# Patient Record
Sex: Female | Born: 1980 | Hispanic: Yes | Smoking: Former smoker
Health system: Southern US, Community
[De-identification: ages and names within clinical notes are randomized; demographics above are authoritative.]

## PROBLEM LIST (undated history)

## (undated) DIAGNOSIS — R87629 Unspecified abnormal cytological findings in specimens from vagina: Secondary | ICD-10-CM

## (undated) DIAGNOSIS — G43909 Migraine, unspecified, not intractable, without status migrainosus: Secondary | ICD-10-CM

## (undated) DIAGNOSIS — N200 Calculus of kidney: Secondary | ICD-10-CM

## (undated) HISTORY — DX: Unspecified abnormal cytological findings in specimens from vagina: R87.629

## (undated) HISTORY — PX: COLPOSCOPY: SHX161

## (undated) HISTORY — DX: Calculus of kidney: N20.0

## (undated) HISTORY — DX: Migraine, unspecified, not intractable, without status migrainosus: G43.909

---

## 2018-06-20 ENCOUNTER — Encounter: Payer: Self-pay | Admitting: Sports Medicine

## 2018-06-20 ENCOUNTER — Ambulatory Visit (INDEPENDENT_AMBULATORY_CARE_PROVIDER_SITE_OTHER): Payer: BLUE CROSS/BLUE SHIELD | Admitting: Sports Medicine

## 2018-06-20 ENCOUNTER — Ambulatory Visit (INDEPENDENT_AMBULATORY_CARE_PROVIDER_SITE_OTHER): Payer: BLUE CROSS/BLUE SHIELD

## 2018-06-20 DIAGNOSIS — M25551 Pain in right hip: Secondary | ICD-10-CM | POA: Diagnosis not present

## 2018-06-20 DIAGNOSIS — G8929 Other chronic pain: Secondary | ICD-10-CM | POA: Insufficient documentation

## 2018-06-20 DIAGNOSIS — M5412 Radiculopathy, cervical region: Secondary | ICD-10-CM

## 2018-06-20 DIAGNOSIS — R519 Headache, unspecified: Secondary | ICD-10-CM | POA: Insufficient documentation

## 2018-06-20 DIAGNOSIS — M4722 Other spondylosis with radiculopathy, cervical region: Secondary | ICD-10-CM | POA: Diagnosis not present

## 2018-06-20 DIAGNOSIS — R51 Headache: Secondary | ICD-10-CM

## 2018-06-20 MED ORDER — PREDNISONE 50 MG PO TABS: ORAL_TABLET | ORAL | 0 refills | Status: DC

## 2018-06-20 MED ORDER — GABAPENTIN 300 MG PO CAPS: ORAL_CAPSULE | ORAL | 3 refills | Status: DC

## 2018-06-20 NOTE — Progress Notes (Signed)
Subjective:    CC: Back pain, hip pain, headaches, neck pain  HPI:  This is a pleasant 37 year old female, she endorses a several month history of mild headaches, that usually occur several hours after eating, she generally feels hungry, developed a headache, and these resolved with eating.  Hip pain: Right sided, localized in the groin, worse with prolonged walking.  No mechanical symptoms, no trauma, no back pain, nothing radiating down past the thigh.  Neck pain: Right-sided, radiation with numbness and tingling to the first, second, third fingers, no progressive weakness, no trauma, no constitutional symptoms.  I reviewed the past medical history, family history, social history, surgical history, and allergies today and no changes were needed.  Please see the problem list section below in epic for further details.  Past Medical History: Past Medical History:  Diagnosis Date  . Kidney stones   . Migraine    Past Surgical History: History reviewed. No pertinent surgical history. Social History: Social History   Socioeconomic History  . Marital status: Married    Spouse name: Not on file  . Number of children: Not on file  . Years of education: Not on file  . Highest education level: Not on file  Occupational History  . Not on file  Social Needs  . Financial resource strain: Not on file  . Food insecurity:    Worry: Not on file    Inability: Not on file  . Transportation needs:    Medical: Not on file    Non-medical: Not on file  Tobacco Use  . Smoking status: Former Games developermoker  . Smokeless tobacco: Never Used  Substance and Sexual Activity  . Alcohol use: Not Currently  . Drug use: Never  . Sexual activity: Yes  Lifestyle  . Physical activity:    Days per week: Not on file    Minutes per session: Not on file  . Stress: Not on file  Relationships  . Social connections:    Talks on phone: Not on file    Gets together: Not on file    Attends religious service: Not  on file    Active member of club or organization: Not on file    Attends meetings of clubs or organizations: Not on file    Relationship status: Not on file  Other Topics Concern  . Not on file  Social History Narrative  . Not on file   Family History: Family History  Problem Relation Age of Onset  . Cancer Maternal Aunt   . Cancer Paternal Aunt   . Cancer Maternal Grandmother   . Cancer Paternal Grandmother    Allergies: No Known Allergies Medications: See med rec.  Review of Systems: No headache, visual changes, nausea, vomiting, diarrhea, constipation, dizziness, abdominal pain, skin rash, fevers, chills, night sweats, swollen lymph nodes, weight loss, chest pain, body aches, joint swelling, muscle aches, shortness of breath, mood changes, visual or auditory hallucinations.  Objective:    General: Well Developed, well nourished, and in no acute distress.  Neuro: Alert and oriented x3, extra-ocular muscles intact, sensation grossly intact.  HEENT: Normocephalic, atraumatic, pupils equal round reactive to light, neck supple, no masses, no lymphadenopathy, thyroid nonpalpable.  Skin: Warm and dry, no rashes noted.  Cardiac: Regular rate and rhythm, no murmurs rubs or gallops.  Respiratory: Clear to auscultation bilaterally. Not using accessory muscles, speaking in full sentences.  Abdominal: Soft, nontender, nondistended, positive bowel sounds, no masses, no organomegaly.  Right Hip: ROM IR: 60 Deg, ER:  60 Deg, Flexion: 120 Deg, Extension: 100 Deg, Abduction: 45 Deg, Adduction: 45 Deg Strength IR: 5/5, ER: 5/5, Flexion: 5/5, Extension: 5/5, Abduction: 5/5, Adduction: 5/5 Pelvic alignment unremarkable to inspection and palpation. Standing hip rotation and gait without trendelenburg / unsteadiness. Greater trochanter without tenderness to palpation. No tenderness over piriformis. No SI joint tenderness and normal minimal SI movement. Negative FADIR test Neck: Negative  spurling's Full neck range of motion Grip strength and sensation normal in bilateral hands Strength good C4 to T1 distribution No sensory change to C4 to T1 Reflexes normal  Impression and Recommendations:    The patient was counselled, risk factors were discussed, anticipatory guidance given.  Frequent headaches Typical occur during long periods of not eating. She will try multiple small meals through the day, if persistence we will evaluate further for migraines.  Right cervical radiculopathy Prednisone, gabapentin. X-rays, rehab exercises given, return to see me in a month, MR for interventional planning if no better.  Right hip pain Likely mild OA. Prednisone will help, getting some x-rays. __________________________________________ Ihor Austin. Benjamin Stain, M.D., ABFM., CAQSM. Primary Care and Sports Medicine [facility] [facility]  Adjunct Professor of Family Medicine  [employer]

## 2018-06-20 NOTE — Assessment & Plan Note (Signed)
Typical occur during long periods of not eating. She will try multiple small meals through the day, if persistence we will evaluate further for migraines.

## 2018-06-20 NOTE — Assessment & Plan Note (Signed)
Prednisone, gabapentin. X-rays, rehab exercises given, return to see me in a month, MR for interventional planning if no better.

## 2018-06-20 NOTE — Assessment & Plan Note (Signed)
Likely mild OA. Prednisone will help, getting some x-rays.

## 2018-06-25 ENCOUNTER — Ambulatory Visit (INDEPENDENT_AMBULATORY_CARE_PROVIDER_SITE_OTHER): Payer: BLUE CROSS/BLUE SHIELD | Admitting: Obstetrics & Gynecology

## 2018-06-25 ENCOUNTER — Encounter: Payer: Self-pay | Admitting: Obstetrics & Gynecology

## 2018-06-25 VITALS — BP 115/76 | HR 74 | Ht 61.0 in | Wt 152.0 lb

## 2018-06-25 DIAGNOSIS — Z13220 Encounter for screening for lipoid disorders: Secondary | ICD-10-CM | POA: Diagnosis not present

## 2018-06-25 DIAGNOSIS — Z808 Family history of malignant neoplasm of other organs or systems: Secondary | ICD-10-CM | POA: Diagnosis not present

## 2018-06-25 DIAGNOSIS — Z8041 Family history of malignant neoplasm of ovary: Secondary | ICD-10-CM | POA: Diagnosis not present

## 2018-06-25 DIAGNOSIS — Z3169 Encounter for other general counseling and advice on procreation: Secondary | ICD-10-CM

## 2018-06-25 DIAGNOSIS — E559 Vitamin D deficiency, unspecified: Secondary | ICD-10-CM

## 2018-06-25 DIAGNOSIS — B373 Candidiasis of vulva and vagina: Secondary | ICD-10-CM | POA: Diagnosis not present

## 2018-06-25 DIAGNOSIS — N898 Other specified noninflammatory disorders of vagina: Secondary | ICD-10-CM

## 2018-06-25 DIAGNOSIS — Z1151 Encounter for screening for human papillomavirus (HPV): Secondary | ICD-10-CM | POA: Diagnosis not present

## 2018-06-25 DIAGNOSIS — Z131 Encounter for screening for diabetes mellitus: Secondary | ICD-10-CM

## 2018-06-25 DIAGNOSIS — Z113 Encounter for screening for infections with a predominantly sexual mode of transmission: Secondary | ICD-10-CM

## 2018-06-25 DIAGNOSIS — Z124 Encounter for screening for malignant neoplasm of cervix: Secondary | ICD-10-CM | POA: Diagnosis not present

## 2018-06-25 DIAGNOSIS — Z01419 Encounter for gynecological examination (general) (routine) without abnormal findings: Secondary | ICD-10-CM

## 2018-06-25 MED ORDER — PRENATAL VITAMINS 0.8 MG PO TABS: 1.0000 | ORAL_TABLET | Freq: Every day | ORAL | 12 refills | Status: DC

## 2018-06-25 MED ORDER — VALACYCLOVIR HCL 500 MG PO TABS: 500.0000 mg | ORAL_TABLET | Freq: Every day | ORAL | 12 refills | Status: DC

## 2018-06-25 NOTE — Progress Notes (Signed)
Last pap- June 2017- negative

## 2018-06-25 NOTE — Progress Notes (Signed)
Subjective:     Jennifer Jacobson is a 37 y.o. female here for a routine exam.  Current complaints: Has HSV outbreaks and uses acyclovir topical.  Husband does not know she has HSV.  She has never been on oral preventative.  Wants full STD testing and preconceptual genetic tests.  Maternal anut died of ovarian cancer and MGM died of metastatic cancer with unknown primary.    Gynecologic History Patient's last menstrual period was 06/04/2018. Contraception: none Last Pap: 2017. Results were: normal (per pt in [place]) Last mammogram: n/a.   Obstetric History OB History  Gravida Para Term Preterm AB Living  2 1 1   1 1   SAB TAB Ectopic Multiple Live Births  1            # Outcome Date GA Lbr Len/2nd Weight Sex Delivery Anes PTL Lv  2 SAB           1 Term              The following portions of the patient's history were reviewed and updated as appropriate: allergies, current medications, past family history, past medical history, past social history, past surgical history and problem list.  Review of Systems Pertinent items noted in HPI and remainder of comprehensive ROS otherwise negative.    Objective:      Vitals:   06/25/18 0850  BP: 115/76  Pulse: 74  Weight: 152 lb (68.9 kg)  Height: 5\' 1"  (1.549 m)   Vitals:  WNL General appearance: alert, cooperative and no distress  HEENT: Normocephalic, without obvious abnormality, atraumatic Eyes: negative Throat: lips, mucosa, and tongue normal; teeth and gums normal  Respiratory: Clear to auscultation bilaterally  CV: Regular rate and rhythm  Breasts:  Normal appearance, no masses or tenderness, no nipple retraction or dimpling  GI: Soft, non-tender; bowel sounds normal; no masses,  no organomegaly  GU: External Genitalia:  Tanner V, no lesion Urethra:  No prolapse   Vagina: Pink, normal rugae, no blood or discharge  Cervix: No CMT, no lesion  Uterus:  Normal size and contour, non tender  Adnexa: Normal, no masses,  non tender  Musculoskeletal: No edema, redness or tenderness in the calves or thighs  Skin: No lesions or rash  Lymphatic: Axillary adenopathy: none     Psychiatric: Normal mood and behavior        Assessment:    Healthy female exam.    Plan:   1.  Pap smear with co-testing 2. Preconceptual genetic testing 3.  PNV (wants to conceive) 4.  Full STD panel 5.  Daily Valtrex to prevent HSV transmission 6.  My Risk testing for Mat Aunt with Ovarian Cancer (mother in [place] and can't be tested)

## 2018-06-26 LAB — CYTOLOGY - PAP
Bacterial vaginitis: NEGATIVE
Candida vaginitis: POSITIVE — AB
Chlamydia: NEGATIVE
Diagnosis: NEGATIVE
HPV (WINDOPATH): DETECTED — AB
NEISSERIA GONORRHEA: NEGATIVE
TRICH (WINDOWPATH): NEGATIVE

## 2018-06-27 ENCOUNTER — Encounter: Payer: Self-pay | Admitting: Obstetrics & Gynecology

## 2018-06-27 DIAGNOSIS — B977 Papillomavirus as the cause of diseases classified elsewhere: Secondary | ICD-10-CM | POA: Insufficient documentation

## 2018-06-27 LAB — HEMOGLOBINOPATHY EVALUATION
HCT: 37 % (ref 35.0–45.0)
Hemoglobin A2 - HGBRFX: 2.4 % (ref 1.8–3.5)
Hemoglobin: 12.5 g/dL (ref 11.7–15.5)
Hgb A: 96.6 % (ref >96.0)
MCH: 30.7 pg (ref 27.0–33.0)
MCV: 90.9 fL (ref 80.0–100.0)
RBC: 4.07 10*6/uL (ref 3.80–5.10)
RDW: 12.4 % (ref 11.0–15.0)

## 2018-06-28 DIAGNOSIS — B379 Candidiasis, unspecified: Secondary | ICD-10-CM

## 2018-06-28 MED ORDER — FLUCONAZOLE 150 MG PO TABS: 150.0000 mg | ORAL_TABLET | Freq: Once | ORAL | 0 refills | Status: AC

## 2018-06-28 NOTE — Telephone Encounter (Signed)
Sent pt a MyChart message letting her know of pap smear results and to repeat pap in a year. Also let her know of yeast infection and that Diflucan has been sent to her pharmacy.

## 2018-07-04 ENCOUNTER — Encounter: Payer: Self-pay | Admitting: Sports Medicine

## 2018-07-05 LAB — CBC
HCT: 37.5 % (ref 35.0–45.0)
Hemoglobin: 12.5 g/dL (ref 11.7–15.5)
MCH: 30.5 pg (ref 27.0–33.0)
MCHC: 33.3 g/dL (ref 32.0–36.0)
MCV: 91.5 fL (ref 80.0–100.0)
MPV: 11.1 fL (ref 7.5–12.5)
Platelets: 248 10*3/uL (ref 140–400)
RBC: 4.1 10*6/uL (ref 3.80–5.10)
RDW: 12.2 % (ref 11.0–15.0)
WBC: 8 10*3/uL (ref 3.8–10.8)

## 2018-07-05 LAB — LIPID PANEL
CHOLESTEROL: 199 mg/dL (ref <200)
HDL: 42 mg/dL — AB (ref >50)
LDL Cholesterol (Calc): 136 mg/dL (calc) — ABNORMAL HIGH
Non-HDL Cholesterol (Calc): 157 mg/dL (calc) — ABNORMAL HIGH (ref <130)
Total CHOL/HDL Ratio: 4.7 (calc) (ref <5.0)
Triglycerides: 104 mg/dL (ref <150)

## 2018-07-05 LAB — CYSTIC FIBROSIS DIAGNOSTIC STUDY

## 2018-07-05 LAB — SPINAL MUSCULAR ATROPHY CARRIER
SMN1: 2
SMN2: >=3

## 2018-07-05 LAB — COMPREHENSIVE METABOLIC PANEL
AG Ratio: 1.5 (calc) (ref 1.0–2.5)
ALBUMIN MSPROF: 4 g/dL (ref 3.6–5.1)
ALT: 12 U/L (ref 6–29)
AST: 15 U/L (ref 10–30)
Alkaline phosphatase (APISO): 38 U/L (ref 33–115)
BUN: 15 mg/dL (ref 7–25)
CO2: 28 mmol/L (ref 20–32)
Calcium: 9.4 mg/dL (ref 8.6–10.2)
Chloride: 107 mmol/L (ref 98–110)
Creat: 0.82 mg/dL (ref 0.50–1.10)
Globulin: 2.7 g/dL (calc) (ref 1.9–3.7)
Glucose, Bld: 93 mg/dL (ref 65–99)
Potassium: 4.3 mmol/L (ref 3.5–5.3)
Sodium: 142 mmol/L (ref 135–146)
Total Bilirubin: 0.4 mg/dL (ref 0.2–1.2)
Total Protein: 6.7 g/dL (ref 6.1–8.1)

## 2018-07-05 LAB — VITAMIN D 25 HYDROXY (VIT D DEFICIENCY, FRACTURES): Vit D, 25-Hydroxy: 26 ng/mL — ABNORMAL LOW (ref 30–100)

## 2018-07-05 LAB — HIV ANTIBODY (ROUTINE TESTING W REFLEX): HIV 1&2 Ab, 4th Generation: NONREACTIVE

## 2018-07-05 LAB — HEMOGLOBIN A1C
HEMOGLOBIN A1C: 5.2 %{Hb} (ref <5.7)
Mean Plasma Glucose: 103 (calc)
eAG (mmol/L): 5.7 (calc)

## 2018-07-05 LAB — RPR: RPR Ser Ql: NONREACTIVE

## 2018-07-05 LAB — HEPATITIS C ANTIBODY
Hepatitis C Ab: NONREACTIVE
SIGNAL TO CUT-OFF: 0.01 (ref <1.00)

## 2018-07-05 LAB — HEPATITIS B SURFACE ANTIGEN: HEP B S AG: NONREACTIVE

## 2018-07-06 ENCOUNTER — Encounter: Payer: Self-pay | Admitting: *Deleted

## 2018-07-06 NOTE — Progress Notes (Signed)
Copy of My Risk mailed to pt's home address and routed to Dr Penne LashLeggett.

## 2018-07-19 ENCOUNTER — Encounter: Payer: Self-pay | Admitting: Sports Medicine

## 2018-07-19 ENCOUNTER — Ambulatory Visit (INDEPENDENT_AMBULATORY_CARE_PROVIDER_SITE_OTHER): Payer: BLUE CROSS/BLUE SHIELD | Admitting: Sports Medicine

## 2018-07-19 DIAGNOSIS — M5412 Radiculopathy, cervical region: Secondary | ICD-10-CM

## 2018-07-19 DIAGNOSIS — R51 Headache: Secondary | ICD-10-CM | POA: Diagnosis not present

## 2018-07-19 DIAGNOSIS — M25551 Pain in right hip: Secondary | ICD-10-CM

## 2018-07-19 DIAGNOSIS — E663 Overweight: Secondary | ICD-10-CM

## 2018-07-19 DIAGNOSIS — E669 Obesity, unspecified: Secondary | ICD-10-CM | POA: Insufficient documentation

## 2018-07-19 DIAGNOSIS — R519 Headache, unspecified: Secondary | ICD-10-CM

## 2018-07-19 NOTE — Assessment & Plan Note (Signed)
Have resolved with eating multiple small meals through the day. No further evaluation for migraines needed.

## 2018-07-19 NOTE — Assessment & Plan Note (Signed)
Resolved with prednisone, gabapentin, rehab exercises. 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

## 2018-07-19 NOTE — Progress Notes (Signed)
Subjective:    CC: Follow-up  HPI: Headaches: Resolved.  Cervical DDD: Pain resolved with gabapentin, prednisone, exercises.  Hip pain: Resolved.  Overweight: Would like referral to nutrition.  I reviewed the past medical history, family history, social history, surgical history, and allergies today and no changes were needed.  Please see the problem list section below in epic for further details.  Past Medical History: Past Medical History:  Diagnosis Date  . Kidney stones   . Migraine   . Vaginal Pap smear, abnormal    Past Surgical History: Past Surgical History:  Procedure Laterality Date  . CESAREAN SECTION    . COLPOSCOPY     Social History: Social History   Socioeconomic History  . Marital status: Married    Spouse name: Not on file  . Number of children: Not on file  . Years of education: Not on file  . Highest education level: Not on file  Occupational History  . Not on file  Social Needs  . Financial resource strain: Not on file  . Food insecurity:    Worry: Not on file    Inability: Not on file  . Transportation needs:    Medical: Not on file    Non-medical: Not on file  Tobacco Use  . Smoking status: Former Smoker    Types: E-cigarettes  . Smokeless tobacco: Never Used  Substance and Sexual Activity  . Alcohol use: Not Currently  . Drug use: Never  . Sexual activity: Yes    Birth control/protection: None  Lifestyle  . Physical activity:    Days per week: Not on file    Minutes per session: Not on file  . Stress: Not on file  Relationships  . Social connections:    Talks on phone: Not on file    Gets together: Not on file    Attends religious service: Not on file    Active member of club or organization: Not on file    Attends meetings of clubs or organizations: Not on file    Relationship status: Not on file  Other Topics Concern  . Not on file  Social History Narrative  . Not on file   Family History: Family History  Problem  Relation Age of Onset  . Ovarian cancer Maternal Aunt 45  . Cancer Maternal Grandmother 33       unkown primary, could be breast  . Hypertension Father   . Heart attack Paternal Grandfather    Allergies: No Known Allergies Medications: See med rec.  Review of Systems: No fevers, chills, night sweats, weight loss, chest pain, or shortness of breath.   Objective:    General: Well Developed, well nourished, and in no acute distress.  Neuro: Alert and oriented x3, extra-ocular muscles intact, sensation grossly intact.  HEENT: Normocephalic, atraumatic, pupils equal round reactive to light, neck supple, no masses, no lymphadenopathy, thyroid nonpalpable.  Skin: Warm and dry, no rashes. Cardiac: Regular rate and rhythm, no murmurs rubs or gallops, no lower extremity edema.  Respiratory: Clear to auscultation bilaterally. Not using accessory muscles, speaking in full sentences.  Impression and Recommendations:    Overweight Referral to [facility] in [place] [employer]. Exercise prescription given.  Frequent headaches Have resolved with eating multiple small meals through the day. No further evaluation for migraines needed.  Right cervical radiculopathy Resolved with prednisone, gabapentin, rehab exercises. 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  Right hip pain Pain was referrable to the hip at the last visit, have resolved now.  Return as needed for this. ___________________________________________ Ihor Austin. Benjamin Stain, M.D., ABFM., CAQSM. Primary Care and Sports Medicine [place] [facility]  Adjunct Professor of Family Medicine  [employer]

## 2018-07-19 NOTE — Assessment & Plan Note (Signed)
Pain was referrable to the hip at the last visit, have resolved now. Return as needed for this.

## 2018-07-19 NOTE — Assessment & Plan Note (Addendum)
Referral to [facility] in [place]. Exercise prescription given.

## 2018-09-07 ENCOUNTER — Encounter: Payer: Self-pay | Admitting: Sports Medicine

## 2018-10-17 ENCOUNTER — Other Ambulatory Visit: Payer: Self-pay | Admitting: *Deleted

## 2018-10-17 DIAGNOSIS — M5412 Radiculopathy, cervical region: Secondary | ICD-10-CM

## 2018-10-17 MED ORDER — GABAPENTIN 300 MG PO CAPS: ORAL_CAPSULE | ORAL | 3 refills | Status: DC

## 2018-11-10 ENCOUNTER — Encounter: Payer: Self-pay | Admitting: Sports Medicine

## 2018-11-13 ENCOUNTER — Encounter: Payer: Self-pay | Admitting: Sports Medicine

## 2018-11-13 NOTE — Telephone Encounter (Signed)
I attempted to contact Tiffany, and left her a voicemail to see what additional information is required.

## 2018-11-15 ENCOUNTER — Encounter (INDEPENDENT_AMBULATORY_CARE_PROVIDER_SITE_OTHER): Payer: BLUE CROSS/BLUE SHIELD | Admitting: Sports Medicine

## 2018-11-15 ENCOUNTER — Other Ambulatory Visit: Payer: Self-pay | Admitting: Obstetrics & Gynecology

## 2018-11-15 ENCOUNTER — Encounter: Payer: Self-pay | Admitting: Sports Medicine

## 2018-11-15 DIAGNOSIS — Z630 Problems in relationship with spouse or partner: Secondary | ICD-10-CM

## 2018-11-15 MED ORDER — ACYCLOVIR 5 % EX OINT: TOPICAL_OINTMENT | CUTANEOUS | 1 refills | Status: DC

## 2018-11-15 MED ORDER — PRENATAL VITAMINS 28-0.8 MG PO TABS: 1.0000 | ORAL_TABLET | Freq: Every day | ORAL | 12 refills | Status: DC

## 2018-11-15 NOTE — Progress Notes (Signed)
Prescription for prenatal vitamins and acyclovir ointment given.

## 2018-11-16 DIAGNOSIS — Z630 Problems in relationship with spouse or partner: Secondary | ICD-10-CM | POA: Insufficient documentation

## 2018-11-16 NOTE — Assessment & Plan Note (Signed)
Husband drinks heavily, I would like her to have some behavioral therapy.  I spent 5 total minutes of online digital evaluation and management services.

## 2018-11-30 ENCOUNTER — Ambulatory Visit (INDEPENDENT_AMBULATORY_CARE_PROVIDER_SITE_OTHER): Payer: BLUE CROSS/BLUE SHIELD | Admitting: Professional

## 2018-11-30 DIAGNOSIS — F4323 Adjustment disorder with mixed anxiety and depressed mood: Secondary | ICD-10-CM

## 2018-12-06 ENCOUNTER — Ambulatory Visit (INDEPENDENT_AMBULATORY_CARE_PROVIDER_SITE_OTHER): Payer: BLUE CROSS/BLUE SHIELD | Admitting: Professional

## 2018-12-06 DIAGNOSIS — F4323 Adjustment disorder with mixed anxiety and depressed mood: Secondary | ICD-10-CM | POA: Diagnosis not present

## 2018-12-14 ENCOUNTER — Ambulatory Visit (INDEPENDENT_AMBULATORY_CARE_PROVIDER_SITE_OTHER): Payer: BLUE CROSS/BLUE SHIELD | Admitting: Professional

## 2018-12-14 DIAGNOSIS — F4323 Adjustment disorder with mixed anxiety and depressed mood: Secondary | ICD-10-CM

## 2018-12-26 ENCOUNTER — Ambulatory Visit (INDEPENDENT_AMBULATORY_CARE_PROVIDER_SITE_OTHER): Payer: BC Managed Care – PPO | Admitting: Professional

## 2018-12-26 DIAGNOSIS — F4323 Adjustment disorder with mixed anxiety and depressed mood: Secondary | ICD-10-CM | POA: Diagnosis not present

## 2019-01-03 ENCOUNTER — Other Ambulatory Visit: Payer: Self-pay

## 2019-01-03 ENCOUNTER — Ambulatory Visit (INDEPENDENT_AMBULATORY_CARE_PROVIDER_SITE_OTHER): Payer: BC Managed Care – PPO | Admitting: Sports Medicine

## 2019-01-03 ENCOUNTER — Encounter: Payer: Self-pay | Admitting: Sports Medicine

## 2019-01-03 ENCOUNTER — Ambulatory Visit (INDEPENDENT_AMBULATORY_CARE_PROVIDER_SITE_OTHER): Payer: BC Managed Care – PPO

## 2019-01-03 VITALS — BP 123/74 | HR 73 | Temp 98.2°F | Ht 61.0 in | Wt 147.0 lb

## 2019-01-03 DIAGNOSIS — J358 Other chronic diseases of tonsils and adenoids: Secondary | ICD-10-CM

## 2019-01-03 DIAGNOSIS — R42 Dizziness and giddiness: Secondary | ICD-10-CM | POA: Diagnosis not present

## 2019-01-03 DIAGNOSIS — H8110 Benign paroxysmal vertigo, unspecified ear: Secondary | ICD-10-CM | POA: Insufficient documentation

## 2019-01-03 DIAGNOSIS — R110 Nausea: Secondary | ICD-10-CM

## 2019-01-03 DIAGNOSIS — M545 Low back pain, unspecified: Secondary | ICD-10-CM | POA: Insufficient documentation

## 2019-01-03 DIAGNOSIS — M533 Sacrococcygeal disorders, not elsewhere classified: Secondary | ICD-10-CM | POA: Diagnosis not present

## 2019-01-03 DIAGNOSIS — H6123 Impacted cerumen, bilateral: Secondary | ICD-10-CM

## 2019-01-03 DIAGNOSIS — H8112 Benign paroxysmal vertigo, left ear: Secondary | ICD-10-CM | POA: Diagnosis not present

## 2019-01-03 LAB — POCT URINALYSIS DIPSTICK
Bilirubin, UA: NEGATIVE
Glucose, UA: NEGATIVE
Ketones, UA: NEGATIVE
Leukocytes, UA: NEGATIVE
Nitrite, UA: NEGATIVE
Protein, UA: NEGATIVE
Spec Grav, UA: 1.015 (ref 1.010–1.025)
Urobilinogen, UA: 0.2 E.U./dL
pH, UA: 5.5 (ref 5.0–8.0)

## 2019-01-03 LAB — POCT URINE PREGNANCY: Preg Test, Ur: NEGATIVE

## 2019-01-03 MED ORDER — PREDNISONE 50 MG PO TABS: ORAL_TABLET | ORAL | 0 refills | Status: DC

## 2019-01-03 NOTE — Assessment & Plan Note (Signed)
Recurrent tonsillitis with tonsilloliths. I would like her to discuss this with ENT.

## 2019-01-03 NOTE — Assessment & Plan Note (Signed)
Left-sided, near the sacroiliac joint. Adding 5 days of prednisone, SI joint rehab exercises, lumbar spine and SI joint x-rays. Return to see me in a month for this.

## 2019-01-03 NOTE — Assessment & Plan Note (Signed)
There is bilateral cerumen impaction, we will irrigate this. The prednisone will likely help as well. She can do the Epley maneuver at home.

## 2019-01-03 NOTE — Progress Notes (Signed)
Subjective:    CC: Multiple issues  HPI: Dizziness: History of vertigo in the past, lately has had some mild vertiginous, spinning symptoms, no tinnitus, no ear pressure.  No other focal neurologic symptoms.  Pharyngitis: Gets frequent episodes of pharyngitis with tonsillitis, she tells me she frequently pops out tonsilloliths.  She would like to discuss this with ENT.  Back pain: Localized in the left low back near the SI joint, worse with walking, no bowel or bladder dysfunction, saddle numbness, constitutional symptoms, no trauma.  Nothing radicular down to the leg or feet.  I reviewed the past medical history, family history, social history, surgical history, and allergies today and no changes were needed.  Please see the problem list section below in epic for further details.  Past Medical History: Past Medical History:  Diagnosis Date  . Kidney stones   . Migraine   . Vaginal Pap smear, abnormal    Past Surgical History: Past Surgical History:  Procedure Laterality Date  . CESAREAN SECTION    . COLPOSCOPY     Social History: Social History   Socioeconomic History  . Marital status: Married    Spouse name: Not on file  . Number of children: Not on file  . Years of education: Not on file  . Highest education level: Not on file  Occupational History  . Not on file  Social Needs  . Financial resource strain: Not on file  . Food insecurity    Worry: Not on file    Inability: Not on file  . Transportation needs    Medical: Not on file    Non-medical: Not on file  Tobacco Use  . Smoking status: Former Smoker    Types: E-cigarettes  . Smokeless tobacco: Never Used  Substance and Sexual Activity  . Alcohol use: Not Currently  . Drug use: Never  . Sexual activity: Yes    Birth control/protection: None  Lifestyle  . Physical activity    Days per week: Not on file    Minutes per session: Not on file  . Stress: Not on file  Relationships  . Social Musicianconnections    Talks on phone: Not on file    Gets together: Not on file    Attends religious service: Not on file    Active member of club or organization: Not on file    Attends meetings of clubs or organizations: Not on file    Relationship status: Not on file  Other Topics Concern  . Not on file  Social History Narrative  . Not on file   Family History: Family History  Problem Relation Age of Onset  . Ovarian cancer Maternal Aunt 45  . Cancer Maternal Grandmother [id number]       unkown primary, could be breast  . Hypertension Father   . Heart attack Paternal Grandfather    Allergies: No Known Allergies Medications: See med rec.  Review of Systems: No fevers, chills, night sweats, weight loss, chest pain, or shortness of breath.   Objective:    General: Well Developed, well nourished, and in no acute distress.  Neuro: Alert and oriented x3, extra-ocular muscles intact, sensation grossly intact.  HEENT: Normocephalic, atraumatic, pupils equal round reactive to light, neck supple, no masses, no lymphadenopathy, thyroid nonpalpable.  Bilateral cerumen impactions Skin: Warm and dry, no rashes. Cardiac: Regular rate and rhythm, no murmurs rubs or gallops, no lower extremity edema.  Respiratory: Clear to auscultation bilaterally. Not using accessory muscles, speaking in full sentences.  Back Exam:  Inspection: Unremarkable  Motion: Flexion 45 deg, Extension 45 deg, Side Bending to 45 deg bilaterally,  Rotation to 45 deg bilaterally  SLR laying: Negative  XSLR laying: Negative  Palpable tenderness: Left sacroiliac joint. FABER: negative. Sensory change: Gross sensation intact to all lumbar and sacral dermatomes.  Reflexes: 2+ at both patellar tendons, 2+ at achilles tendons, Babinski's downgoing.  Strength at foot  Plantar-flexion: 5/5 Dorsi-flexion: 5/5 Eversion: 5/5 Inversion: 5/5  Leg strength  Quad: 5/5 Hamstring: 5/5 Hip flexor: 5/5 Hip abductors: 5/5  Gait unremarkable.  Indication:  Cerumen impaction of the left and right ear(s) Medical necessity statement: On physical examination, cerumen impairs clinically significant portions of the external auditory canal, and tympanic membrane. Noted obstructive, copious cerumen that cannot be removed without magnification and instrumentations requiring physician skills Consent: Discussed benefits and risks of procedure and verbal consent obtained Procedure: Patient was prepped for the procedure. Utilized an otoscope to assess and take note of the ear canal, the tympanic membrane, and the presence, amount, and placement of the cerumen. Gentle water irrigation was utilized to remove cerumen.  Post procedure examination: shows cerumen was completely removed. Patient tolerated procedure well. The patient is made aware that they may experience temporary vertigo, temporary hearing loss, and temporary discomfort. If these symptom last for more than 24 hours to call the clinic or proceed to the ED.  Impression and Recommendations:    Acute low back pain Left-sided, near the sacroiliac joint. Adding 5 days of prednisone, SI joint rehab exercises, lumbar spine and SI joint x-rays. Return to see me in a month for this.  Benign positional vertigo There is bilateral cerumen impaction, we will irrigate this. The prednisone will likely help as well. She can do the Epley maneuver at home.  Tonsillolith Recurrent tonsillitis with tonsilloliths. I would like her to discuss this with ENT.   ___________________________________________ Gwen Her. Dianah Field, M.D., ABFM., CAQSM. Primary Care and Sports Medicine  [facility]  Adjunct Professor of [place] [employer] [facility] [employer]

## 2019-01-03 NOTE — Patient Instructions (Signed)
Cmo realizar la maniobra de Epley  How to Perform the Epley Maneuver  La maniobra de Epley es un ejercicio que alivia los sntomas del vrtigo. El vrtigo es la sensacin de que usted o todo lo que lo rodea se mueven cuando en realidad eso no sucede. Cuando una persona siente vrtigo, puede tener la sensacin de que la habitacin da vueltas y dificultad para caminar. Los mareos son un poco diferentes al vrtigo. Cuando una persona est mareada, puede tener inestabilidad o sensacin de desvanecimiento.  Puede realizar esta maniobra en su casa cuando tenga los sntomas de vrtigo. Puede realizarla 3veces por da como mximo hasta que desaparezcan los sntomas.  Aunque la maniobra de Epley lo alivie del vrtigo durante algunas semanas, es posible que los sntomas vuelvan. Esta maniobra alivia los sntomas del vrtigo, pero no los mareos.  Cules son los riesgos?  Si se realiza de forma correcta, la maniobra de Epley se considera un procedimiento seguro. En ocasiones, puede provocar mareos o nuseas que desaparecen despus de un breve perodo. Si tiene otros sntomas, como cambios en la visin, debilidad o entumecimiento, deje de realizar la maniobra y llame al mdico.  Cmo realizar la maniobra de Epley  1. Sintese en el borde de una cama o una mesa con la espalda recta y las piernas extendidas o colgando sobre el borde de la cama o la mesa.  2. Gire la cabeza a medias hacia el odo o el lado afectado.  3. Recustese hacia atrs con la cabeza girada hasta que se encuentre recostado sobre la espalda. Es conveniente colocar una almohada debajo de los hombros.  4. Mantenga esta posicin durante 30segundos. Es posible que tenga una crisis de vrtigo. Esto es normal.  5. Gire la cabeza en direccin opuesta hasta que el odo no afectado est orientado al suelo.  6. Mantenga esta posicin durante 30segundos. Es posible que tenga una crisis de vrtigo. Esto es normal. Mantenga esta posicin hasta que el vrtigo  desaparezca.  7. Gire todo el cuerpo hacia el mismo lado que la cabeza. Mantenga esta posicin durante otros 30segundos.  8. Vuelva a sentarse.  Puede repetir este ejercicio hasta 3veces por da.  Siga estas indicaciones en su casa:   Puede retomar sus actividades normales despus de realizar la maniobra de Epley.   Pregntele al mdico si debe hacer algo en su casa para evitar el vrtigo. El mdico puede recomendarle lo siguiente:  ? Mantener la cabeza levantada (elevada) con dos o ms almohadas mientras duerme.  ? No dormir sobre el lado del odo afectado.  ? Levantarse lentamente de la cama.  ? Evitar los movimientos repentinos durante el da.  ? Evitar los movimientos de cabeza intensos, como mirar hacia arriba o agacharse.  Comunquese con un mdico si:   El vrtigo empeora.   Tiene otros sntomas, como los siguientes:  ? Nuseas.  ? Vmitos.  ? Dolor de cabeza.  Solicite ayuda de inmediato si:   Nota cambios en la visin.   Sufre un dolor de cabeza o en el cuello intenso o que empeora.   No puede dejar de vomitar.   Siente entumecimiento o debilidad nuevos en alguna parte del cuerpo.  Resumen   El vrtigo es la sensacin de que usted o todo lo que lo rodea se mueven cuando en realidad eso no sucede.   La maniobra de Epley es un ejercicio que alivia los sntomas del vrtigo.   Si se realiza de forma correcta, la maniobra de   Epley se considera un procedimiento seguro. Puede realizarla 3veces por da como mximo.  Esta informacin no tiene como fin reemplazar el consejo del mdico. Asegrese de hacerle al mdico cualquier pregunta que tenga.  Document Released: 07/09/2013 Document Revised: 02/28/2017 Document Reviewed: 02/28/2017  Elsevier Interactive Patient Education  2019 Elsevier Inc.

## 2019-01-09 ENCOUNTER — Ambulatory Visit: Payer: BC Managed Care – PPO | Admitting: Professional

## 2019-01-15 DIAGNOSIS — J358 Other chronic diseases of tonsils and adenoids: Secondary | ICD-10-CM | POA: Diagnosis not present

## 2019-01-15 DIAGNOSIS — J0391 Acute recurrent tonsillitis, unspecified: Secondary | ICD-10-CM | POA: Diagnosis not present

## 2019-01-17 ENCOUNTER — Ambulatory Visit: Payer: BLUE CROSS/BLUE SHIELD | Admitting: Sports Medicine

## 2019-01-20 ENCOUNTER — Encounter: Payer: Self-pay | Admitting: Sports Medicine

## 2019-01-24 ENCOUNTER — Ambulatory Visit: Payer: BC Managed Care – PPO | Admitting: Professional

## 2019-01-31 ENCOUNTER — Ambulatory Visit: Payer: Self-pay | Admitting: Sports Medicine

## 2019-02-03 DIAGNOSIS — R070 Pain in throat: Secondary | ICD-10-CM | POA: Diagnosis not present

## 2019-02-03 DIAGNOSIS — Z20828 Contact with and (suspected) exposure to other viral communicable diseases: Secondary | ICD-10-CM | POA: Diagnosis not present

## 2019-02-13 DIAGNOSIS — U071 COVID-19: Secondary | ICD-10-CM | POA: Diagnosis not present

## 2019-02-17 DIAGNOSIS — Z20828 Contact with and (suspected) exposure to other viral communicable diseases: Secondary | ICD-10-CM | POA: Diagnosis not present

## 2019-05-23 ENCOUNTER — Encounter: Payer: Self-pay | Admitting: Sports Medicine

## 2019-05-23 NOTE — Telephone Encounter (Signed)
Left patient a voicemail with information below. Let patient know to call us back to schedule an appointment in the office. °

## 2019-05-24 ENCOUNTER — Ambulatory Visit (INDEPENDENT_AMBULATORY_CARE_PROVIDER_SITE_OTHER): Payer: BC Managed Care – PPO | Admitting: Sports Medicine

## 2019-05-24 ENCOUNTER — Other Ambulatory Visit: Payer: Self-pay

## 2019-05-24 ENCOUNTER — Encounter: Payer: Self-pay | Admitting: Sports Medicine

## 2019-05-24 VITALS — BP 118/73 | HR 75 | Ht 61.0 in | Wt 146.0 lb

## 2019-05-24 DIAGNOSIS — N3000 Acute cystitis without hematuria: Secondary | ICD-10-CM | POA: Insufficient documentation

## 2019-05-24 DIAGNOSIS — Z23 Encounter for immunization: Secondary | ICD-10-CM | POA: Diagnosis not present

## 2019-05-24 DIAGNOSIS — R300 Dysuria: Secondary | ICD-10-CM

## 2019-05-24 DIAGNOSIS — H8112 Benign paroxysmal vertigo, left ear: Secondary | ICD-10-CM | POA: Diagnosis not present

## 2019-05-24 DIAGNOSIS — Z0000 Encounter for general adult medical examination without abnormal findings: Secondary | ICD-10-CM | POA: Insufficient documentation

## 2019-05-24 DIAGNOSIS — Z3009 Encounter for other general counseling and advice on contraception: Secondary | ICD-10-CM

## 2019-05-24 LAB — POCT URINALYSIS DIPSTICK
Bilirubin, UA: NEGATIVE
Glucose, UA: NEGATIVE
Ketones, UA: NEGATIVE
Nitrite, UA: NEGATIVE
Protein, UA: NEGATIVE
Spec Grav, UA: 1.02 (ref 1.010–1.025)
Urobilinogen, UA: 0.2 E.U./dL
pH, UA: 7 (ref 5.0–8.0)

## 2019-05-24 MED ORDER — NITROFURANTOIN MONOHYD MACRO 100 MG PO CAPS: 100.0000 mg | ORAL_CAPSULE | Freq: Two times a day (BID) | ORAL | 0 refills | Status: DC

## 2019-05-24 NOTE — Assessment & Plan Note (Signed)
Flu shot today 

## 2019-05-24 NOTE — Assessment & Plan Note (Signed)
Desires to get pregnant, husband is on testosterone supplementation so he will need to stop this. I would likely have them do a semen analysis 3 months after stopping testosterone. 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

## 2019-05-24 NOTE — Progress Notes (Signed)
Subjective:    CC: UTI symptoms  HPI: Mild urgency, frequency, burning.  No discharge.  Vertigo: Previously will be diagnosed that she had significant cerumen impactions, the cerumen impactions were cleared and she maybe had a slight improvement but continues to have vertiginous symptoms when turning her head.  Family-planning: She and her husband desire to get pregnant, they have been trying using the calendar method for over a year now.  Her husband is using testosterone injections and I explained to her that this will result in azoospermia.  I reviewed the past medical history, family history, social history, surgical history, and allergies today and no changes were needed.  Please see the problem list section below in epic for further details.  Past Medical History: Past Medical History:  Diagnosis Date  . Kidney stones   . Migraine   . Vaginal Pap smear, abnormal    Past Surgical History: Past Surgical History:  Procedure Laterality Date  . CESAREAN SECTION    . COLPOSCOPY     Social History: Social History   Socioeconomic History  . Marital status: Married    Spouse name: Not on file  . Number of children: Not on file  . Years of education: Not on file  . Highest education level: Not on file  Occupational History  . Not on file  Social Needs  . Financial resource strain: Not on file  . Food insecurity    Worry: Not on file    Inability: Not on file  . Transportation needs    Medical: Not on file    Non-medical: Not on file  Tobacco Use  . Smoking status: Former Smoker    Types: E-cigarettes  . Smokeless tobacco: Never Used  Substance and Sexual Activity  . Alcohol use: Not Currently  . Drug use: Never  . Sexual activity: Yes    Birth control/protection: None  Lifestyle  . Physical activity    Days per week: Not on file    Minutes per session: Not on file  . Stress: Not on file  Relationships  . Social Herbalist on phone: Not on file   Gets together: Not on file    Attends religious service: Not on file    Active member of club or organization: Not on file    Attends meetings of clubs or organizations: Not on file    Relationship status: Not on file  Other Topics Concern  . Not on file  Social History Narrative  . Not on file   Family History: Family History  Problem Relation Age of Onset  . Ovarian cancer Maternal Aunt 45  . Cancer Maternal Grandmother 2       unkown primary, could be breast  . Hypertension Father   . Heart attack Paternal Grandfather    Allergies: No Known Allergies Medications: See med rec.  Review of Systems: No fevers, chills, night sweats, weight loss, chest pain, or shortness of breath.   Objective:    General: Well Developed, well nourished, and in no acute distress.  Neuro: Alert and oriented x3, extra-ocular muscles intact, sensation grossly intact.  HEENT: Normocephalic, atraumatic, pupils equal round reactive to light, neck supple, no masses, no lymphadenopathy, thyroid nonpalpable.  Skin: Warm and dry, no rashes. Cardiac: Regular rate and rhythm, no murmurs rubs or gallops, no lower extremity edema.  Respiratory: Clear to auscultation bilaterally. Not using accessory muscles, speaking in full sentences. Abdomen: Soft, nontender, nondistended, no bowel sounds, no palpable masses,  no guarding, rigidity, rebound tenderness.  No costovertebral angle pain.  Urinalysis positive for leukocytes.  Impression and Recommendations:    Acute cystitis Starting Macrobid. Awaiting culture.  Benign positional vertigo Slight improvement with bilateral cerumen impaction removal, unfortunately she continues to have intermittent vertiginous symptoms. Pulling the trigger for vestibular rehab, at least one session.  Family planning advice Desires to get pregnant, husband is on testosterone supplementation so he will need to stop this. I would likely have them do a semen analysis 3 months  after stopping testosterone. 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  Annual physical exam Flu shot today.   ___________________________________________ Ihor Austin. Benjamin Stain, M.D., ABFM., CAQSM. Primary Care and Sports Medicine [facility]  Adjunct Professor of Family Medicine  [employer]

## 2019-05-24 NOTE — Assessment & Plan Note (Signed)
Starting Macrobid. Awaiting culture.

## 2019-05-24 NOTE — Assessment & Plan Note (Signed)
Slight improvement with bilateral cerumen impaction removal, unfortunately she continues to have intermittent vertiginous symptoms. Pulling the trigger for vestibular rehab, at least one session.

## 2019-05-26 LAB — URINE CULTURE
MICRO NUMBER:: 1073659
SPECIMEN QUALITY:: ADEQUATE

## 2019-06-10 ENCOUNTER — Ambulatory Visit (INDEPENDENT_AMBULATORY_CARE_PROVIDER_SITE_OTHER): Payer: BC Managed Care – PPO | Admitting: Rehabilitative and Restorative Service Providers"

## 2019-06-10 ENCOUNTER — Other Ambulatory Visit: Payer: Self-pay

## 2019-06-10 DIAGNOSIS — H8111 Benign paroxysmal vertigo, right ear: Secondary | ICD-10-CM | POA: Diagnosis not present

## 2019-06-10 DIAGNOSIS — R42 Dizziness and giddiness: Secondary | ICD-10-CM | POA: Diagnosis not present

## 2019-06-10 NOTE — Therapy (Signed)
[place] [facility] [address] [place], [place], [address] Phone: [phone]   Fax:  [phone]  Physical Therapy Evaluation  Patient Details  Name: Jennifer Jacobson MRN: [id number] Date of Birth: 1981/01/06 Referring Provider (PT): Silverio Decamp, MD   Encounter Date: 06/10/2019  PT End of Session - 06/10/19 0749    Visit Number  1    Number of Visits  41    Date for PT Re-Evaluation  07/10/19    Authorization Type  BCBS    PT Start Time  0715    PT Stop Time  3716    PT Time Calculation (min)  32 min       Past Medical History:  Diagnosis Date  . Kidney stones   . Migraine   . Vaginal Pap smear, abnormal     Past Surgical History:  Procedure Laterality Date  . CESAREAN SECTION    . COLPOSCOPY      There were no vitals filed for this visit.   Subjective Assessment - 06/10/19 0717    Subjective  The patient reports h/o intermittent episodes of vertigo in the past.  She reports a history of passing out due to vertigo 11 years ago with hospitalization.  Her recent episode began 11/2018.  She reports head movement, bending forward or looking up increase symptoms and the room moves.  She denies nausea, she does have imbalance, she denies vision changes, denies hearing changes, denies ear fullness.    Pertinent History  migraines (dizziness is worse during migraine, but can have dizziness without migraine as well).    Patient Stated Goals  reduce vertigo    Currently in Pain?  No/denies         [facility] PT Assessment - 06/10/19 0721      Assessment   Medical Diagnosis  H81.12 (ICD-10-CM) - Benign paroxysmal positional vertigo of left ear    Referring Provider (PT)  Silverio Decamp, MD    Prior Therapy  treated in the past for vertigo      Precautions   Precautions  None      Restrictions   Weight Bearing Restrictions  No      Balance Screen   Has the patient fallen in the past 6 months   No    Has the patient had a decrease in activity level because of a fear of falling?   No    Is the patient reluctant to leave their home because of a fear of falling?   No      Home Environment   Living Environment  Private residence      Prior Function   Level of Independence  Independent           Vestibular Assessment - 06/10/19 0722      Vestibular Assessment   General Observation  Walks into clinic independently.      Symptom Behavior   Subjective history of current problem  Episodes of intermittent vertigo in history with severe episode with medical workup 11 years ago    Type of Dizziness   Spinning;Imbalance    Frequency of Dizziness  daily    Duration of Dizziness  seconds    Symptom Nature  Positional;Motion provoked    Aggravating Factors  Mornings;Turning head quickly;Forward bending    Relieving Factors  Head stationary    Progression of Symptoms  Worse      Oculomotor Exam   Oculomotor Alignment  Normal  Ocular ROM  WFLs    Spontaneous  Absent    Gaze-induced   Absent    Smooth Pursuits  Intact    Saccades  Intact      Vestibulo-Ocular Reflex   VOR 1 Head Only (x 1 viewing)  self selected pace x 8 reps provokes mild sensation of dizziness     Comment  Head impulse test= positive bilaterally for small amplitude refixation saccade indicating dec'd use of VOR      Positional Testing   Dix-Hallpike  Dix-Hallpike Right;Dix-Hallpike Left    Horizontal Canal Testing  Horizontal Canal Right;Horizontal Canal Left      Dix-Hallpike Right   Dix-Hallpike Right Duration  Patient got short duration (<10 seconds) sensation of spinning    Dix-Hallpike Right Symptoms  No nystagmus   not viewed in room light, patient uses visual fixation     Dix-Hallpike Left   Dix-Hallpike Left Duration  no    Dix-Hallpike Left Symptoms  No nystagmus      Horizontal Canal Right   Horizontal Canal Right Duration  no    Horizontal Canal Right Symptoms  Normal      Horizontal  Canal Left   Horizontal Canal Left Duration  no    Horizontal Canal Left Symptoms  Normal          Objective measurements completed on examination: See above findings.       Vestibular Treatment/Exercise - 06/10/19 0733      Vestibular Treatment/Exercise   Vestibular Treatment Provided  Canalith Repositioning;Habituation;Gaze    Canalith Repositioning  Epley Manuever Right    Habituation Exercises  Austin MilesBrandt Daroff    Gaze Exercises  X1 Viewing Horizontal       EPLEY MANUEVER RIGHT   Number of Reps   2    Overall Response  Improved Symptoms    Response Details   notes a trace remaining on 2nd rep            PT Education - 06/10/19 0740    Education Details  HEP    Person(s) Educated  Patient    Methods  Explanation;Demonstration;Handout    Comprehension  Verbalized understanding;Returned demonstration          PT Long Term Goals - 06/10/19 0749      PT LONG TERM GOAL #1   Title  The patient will have negative R dix hallpike for nystagmus and subjective report of dizziness.    Time  4    Period  Weeks    Target Date  07/10/19      PT LONG TERM GOAL #2   Title  The patient will report no dizziness with forward bending or rising in the morning.    Time  4    Period  Weeks    Target Date  07/10/19      PT LONG TERM GOAL #3   Title  The patient will be indep with self mgmt of vertigo due to h/o recurring episodic vertigo.    Time  4    Period  Weeks    Target Date  07/10/19             Plan - 06/10/19 0751    Clinical Impression Statement  The patient is a 38 year old female presenting with h/o episodic vertigo that recurred beginning May 2020.  She presents with subjective vertigo with R dix hallpike lasting seconds (nystagmus not viewed-- patient appears to use visual fixation to suppress), + head impulse testing  indicating dec'd use of VOR, and reports of imbalance during episodes of dizziness.  PT to address to optimize functional status.     Personal Factors and Comorbidities  Comorbidity 1    Comorbidities  migraines    Examination-Activity Limitations  Bend;Bed Mobility    Stability/Clinical Decision Making  Stable/Uncomplicated    Clinical Decision Making  Low    Rehab Potential  Good    PT Frequency  1x / week    PT Duration  4 weeks    PT Treatment/Interventions  ADLs/Self Care Home Management;Canalith Repostioning;Vestibular;Patient/family education;Neuromuscular re-education;Gait training    PT Next Visit Plan  check BPPV,    PT Home Exercise Plan  Access Code: [email or web address]    Consulted and Agree with Plan of Care  Patient       Patient will benefit from skilled therapeutic intervention in order to improve the following deficits and impairments:  Dizziness, Decreased balance, Decreased activity tolerance  Visit Diagnosis: BPPV (benign paroxysmal positional vertigo), right  Dizziness and giddiness     Problem List Patient Active Problem List   Diagnosis Date Noted  . Acute cystitis 05/24/2019  . Family planning advice 05/24/2019  . Annual physical exam 05/24/2019  . Acute low back pain 01/03/2019  . Benign positional vertigo 01/03/2019  . Tonsillolith 01/03/2019  . Marital conflict 11/16/2018  . Overweight 07/19/2018  . 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 Right cervical radiculopathy 06/20/2018  . Right hip pain 06/20/2018  . Frequent headaches 06/20/2018    Onyekachi Gathright, PT 06/10/2019, 7:53 AM  [facility] [address] [place], [place], [address] Phone: [phone]   Fax:  [phone]  Name: Jennifer Jacobson MRN: [id number] Date of Birth: 02-04-1981

## 2019-06-10 NOTE — Patient Instructions (Signed)
Access Code: [email or web address]  URL: [email or web address]  Date: 06/10/2019  Prepared by: Rudell Cobb   Exercises Brandt-Daroff Vestibular Exercise - 5 reps - 1 sets - 2x daily - 7x weekly Standing Gaze Stabilization with Head Rotation - 1 reps - 2 sets - 3x daily - 7x weekly

## 2019-06-17 ENCOUNTER — Encounter: Payer: Self-pay | Admitting: Rehabilitative and Restorative Service Providers"

## 2019-06-17 ENCOUNTER — Ambulatory Visit (INDEPENDENT_AMBULATORY_CARE_PROVIDER_SITE_OTHER): Payer: Self-pay | Admitting: Rehabilitative and Restorative Service Providers"

## 2019-06-17 ENCOUNTER — Other Ambulatory Visit: Payer: Self-pay

## 2019-06-17 DIAGNOSIS — H8111 Benign paroxysmal vertigo, right ear: Secondary | ICD-10-CM | POA: Diagnosis not present

## 2019-06-17 DIAGNOSIS — R42 Dizziness and giddiness: Secondary | ICD-10-CM | POA: Diagnosis not present

## 2019-06-17 NOTE — Patient Instructions (Signed)
Access Code: [email or web address]  URL: [email or web address]  Date: 06/17/2019  Prepared by: Rudell Cobb   Exercises Brandt-Daroff Vestibular Exercise - 5 reps - 1 sets - 2x daily - 7x weekly Standing Gaze Stabilization with Head Rotation - 1 reps - 2 sets - 3x daily - 7x weekly Standing Gaze Stabilization with Head Nod - 1 reps - 2 sets - 3x daily - 7x weekly Wide Stance with Eyes Closed on Foam Pad - 3 reps - 1 sets - 30 seconds hold - 2x daily - 7x weekly Wide Stance with Head Rotation on Foam Pad - 10 reps - 3 sets - 2x daily - 7x weekly Wide Stance with Head Nods on Foam Pad - 10 reps - 1 sets - 2x daily - 7x weekly

## 2019-06-17 NOTE — Therapy (Signed)
[facility] [place] [address] [place], [place], [address] Phone: [phone]   Fax:  [phone]  Physical Therapy Treatment  Patient Details  Name: Jennifer Jacobson MRN: [id number] Date of Birth: [birth year] Referring Provider (PT): Monica Becton, MD   Encounter Date: 06/17/2019  PT End of Session - 06/17/19 0801    Visit Number  2    Number of Visits  41    Date for PT Re-Evaluation  07/10/19    Authorization Type  BCBS    PT Start Time  [phone]    PT Stop Time  0800    PT Time Calculation (min)  29 min       Past Medical History:  Diagnosis Date  . Kidney stones   . Migraine   . Vaginal Pap smear, abnormal     Past Surgical History:  Procedure Laterality Date  . CESAREAN SECTION    . COLPOSCOPY      There were no vitals filed for this visit.  Subjective Assessment - 06/17/19 0733    Subjective  The patient noticed when she completes VOR without glasses it is blurry.  Getting into bed is better.    Pertinent History  migraines (dizziness is worse during migraine, but can have dizziness without migraine as well).    Patient Stated Goals  reduce vertigo    Currently in Pain?  No/denies             Vestibular Assessment - 06/17/19 0734      Positional Testing   Dix-Hallpike  Dix-Hallpike Right;Dix-Hallpike Left    Horizontal Canal Testing  Horizontal Canal Right;Horizontal Canal Left      Dix-Hallpike Right   Dix-Hallpike Right Duration  10 second    Dix-Hallpike Right Symptoms  Upbeat, right rotatory nystagmus      Dix-Hallpike Left   Dix-Hallpike Left Duration  none    Dix-Hallpike Left Symptoms  No nystagmus      Horizontal Canal Right   Horizontal Canal Right Duration  none    Horizontal Canal Right Symptoms  Normal      Horizontal Canal Left   Horizontal Canal Left Duration  none    Horizontal Canal Left Symptoms  Normal               [facility] Adult PT  Treatment/Exercise - 06/17/19 1338      Neuro Re-ed    Neuro Re-ed Details   Compliant surface standing with head motion horizontal and vertical and compliant surfaces + eyes closed      Vestibular Treatment/Exercise - 06/17/19 0736      Vestibular Treatment/Exercise   Vestibular Treatment Provided  Canalith Repositioning;Habituation;Gaze    Canalith Repositioning  Epley Manuever Right    Habituation Exercises  [employer]    Gaze Exercises  X1 Viewing Horizontal;X1 Viewing Vertical       EPLEY MANUEVER RIGHT   Number of Reps   2    Overall Response  Symptoms Resolved      Austin Miles   Number of Reps   3      X1 Viewing Horizontal   Foot Position  standing gaze x 1    Comments  60 seconds      X1 Viewing Vertical   Foot Position  standing gaze feet apart    Comments  30 seconds            PT Education - 06/17/19 1321    Education Details  HEP  progression.    Person(s) Educated  Patient    Methods  Explanation;Demonstration;Handout    Comprehension  Verbalized understanding;Returned demonstration          PT Long Term Goals - 06/10/19 0749      PT LONG TERM GOAL #1   Title  The patient will have negative R dix hallpike for nystagmus and subjective report of dizziness.    Time  4    Period  Weeks    Target Date  07/10/19      PT LONG TERM GOAL #2   Title  The patient will report no dizziness with forward bending or rising in the morning.    Time  4    Period  Weeks    Target Date  07/10/19      PT LONG TERM GOAL #3   Title  The patient will be indep with self mgmt of vertigo due to h/o recurring episodic vertigo.    Time  4    Period  Weeks    Target Date  07/10/19            Plan - 06/17/19 0802    Clinical Impression Statement  The patient has visible nystagmus in room light today and responded well to  Epley's maneuver. PT to continue working to [employer].    Personal Factors and Comorbidities  Comorbidity 1    Comorbidities  migraines     Examination-Activity Limitations  Bend;Bed Mobility    Stability/Clinical Decision Making  Stable/Uncomplicated    Rehab Potential  Good    PT Frequency  1x / week    PT Duration  4 weeks    PT Treatment/Interventions  ADLs/Self Care Home Management;Canalith Repostioning;Vestibular;Patient/family education;Neuromuscular re-education;Gait training    PT Next Visit Plan  check BPPV, check HEP, progress balance training.    PT Home Exercise Plan  Access Code: [email or web address]    Consulted and Agree with Plan of Care  Patient       Patient will benefit from skilled therapeutic intervention in order to improve the following deficits and impairments:  Dizziness, Decreased balance, Decreased activity tolerance  Visit Diagnosis: BPPV (benign paroxysmal positional vertigo), right  Dizziness and giddiness     Problem List Patient Active Problem List   Diagnosis Date Noted  . Acute cystitis 05/24/2019  . Family planning advice 05/24/2019  . Annual physical exam 05/24/2019  . Acute low back pain 01/03/2019  . Benign positional vertigo 01/03/2019  . Tonsillolith 01/03/2019  . Marital conflict 41/28/7867  . Overweight 07/19/2018  . 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 Right cervical radiculopathy 06/20/2018  . Right hip pain 06/20/2018  . Frequent headaches 06/20/2018    [place], PT 06/17/2019, 9:14 PM  [facility] [address] [place], [place], [address] Phone: [phone]   Fax:  [phone]  Name: Jennifer Jacobson MRN: [id number] Date of Birth: 1980-10-20

## 2019-07-01 ENCOUNTER — Other Ambulatory Visit: Payer: Self-pay

## 2019-07-01 ENCOUNTER — Encounter: Payer: Self-pay | Admitting: Obstetrics & Gynecology

## 2019-07-01 ENCOUNTER — Ambulatory Visit (INDEPENDENT_AMBULATORY_CARE_PROVIDER_SITE_OTHER): Payer: BC Managed Care – PPO | Admitting: Obstetrics & Gynecology

## 2019-07-01 VITALS — BP 119/74 | HR 76 | Ht 61.0 in | Wt 150.0 lb

## 2019-07-01 DIAGNOSIS — Z1151 Encounter for screening for human papillomavirus (HPV): Secondary | ICD-10-CM | POA: Diagnosis not present

## 2019-07-01 DIAGNOSIS — Z124 Encounter for screening for malignant neoplasm of cervix: Secondary | ICD-10-CM | POA: Diagnosis not present

## 2019-07-01 DIAGNOSIS — Z01419 Encounter for gynecological examination (general) (routine) without abnormal findings: Secondary | ICD-10-CM

## 2019-07-01 DIAGNOSIS — R102 Pelvic and perineal pain: Secondary | ICD-10-CM

## 2019-07-01 MED ORDER — PRENATAL VITAMINS 28-0.8 MG PO TABS: 1.0000 | ORAL_TABLET | Freq: Every day | ORAL | 12 refills | Status: DC

## 2019-07-01 MED ORDER — VALACYCLOVIR HCL 500 MG PO TABS: 500.0000 mg | ORAL_TABLET | Freq: Every day | ORAL | 12 refills | Status: DC

## 2019-07-01 MED ORDER — ACYCLOVIR 5 % EX OINT: TOPICAL_OINTMENT | CUTANEOUS | 1 refills | Status: DC

## 2019-07-01 NOTE — Progress Notes (Signed)
Subjective:     Jennifer Jacobson is a 38 y.o. female here for a routine exam.  Current complaints: Right sided pelvic pain for 1 month.  Not present today, comes and goes, cramping and fullness.   Gynecologic History Patient's last menstrual period was 06/18/2019. Contraception: none Last Pap: 12/19. Results were: HPV positive (nml cytology) Last mammogram: n/a.   Obstetric History OB History  Gravida Para Term Preterm AB Living  2 1 1   1 1   SAB TAB Ectopic Multiple Live Births  1            # Outcome Date GA Lbr Len/2nd Weight Sex Delivery Anes PTL Lv  2 SAB           1 Term              The following portions of the patient's history were reviewed and updated as appropriate: allergies, current medications, past family history, past medical history, past social history, past surgical history and problem list.  Review of Systems Pertinent items noted in HPI and remainder of comprehensive ROS otherwise negative.    Objective:      Vitals:   07/01/19 0859  BP: (!) 131/92  Pulse: 76  Weight: 150 lb (68 kg)  Height: 5\' 1"  (1.549 m)   Vitals:  WNL General appearance: alert, cooperative and no distress  HEENT: Normocephalic, without obvious abnormality, atraumatic Eyes: negative Throat: lips, mucosa, and tongue normal; teeth and gums normal  Respiratory: Clear to auscultation bilaterally  CV: Regular rate and rhythm  Breasts:  Normal appearance, no masses or tenderness, no nipple retraction or dimpling  GI: Soft, non-tender; bowel sounds normal; no masses,  no organomegaly  GU: External Genitalia:  Tanner V, no lesion Urethra:  No prolapse   Vagina: Pink, normal rugae, no blood or discharge  Cervix: No CMT, no lesion  Uterus:  Normal size and contour, non tender  Adnexa: Normal, no masses, moderate tenderness over right ovary with bimanual  Musculoskeletal: No edema, redness or tenderness in the calves or thighs  Skin: No lesions or rash  Lymphatic: Axillary  adenopathy: none     Psychiatric: Normal mood and behavior   Assessment:    Healthy female exam.    Plan:   1.  Pap with cotesting 2.  TVUS to evaluate right adnexal tenderness. 3.  Continue PNV as trying to conceive 4.  Continue valtex and ointment 5.  Discussed Covid vaccine when available

## 2019-07-01 NOTE — Progress Notes (Signed)
Last pap 06/25/18- negative- HPV detected First BP 131 92 Repeat BP 119 74

## 2019-07-02 LAB — CYTOLOGY - PAP
Comment: NEGATIVE
Diagnosis: NEGATIVE
High risk HPV: NEGATIVE

## 2019-07-05 ENCOUNTER — Other Ambulatory Visit: Payer: BC Managed Care – PPO

## 2019-07-08 ENCOUNTER — Ambulatory Visit (INDEPENDENT_AMBULATORY_CARE_PROVIDER_SITE_OTHER): Payer: BC Managed Care – PPO | Admitting: Rehabilitative and Restorative Service Providers"

## 2019-07-08 ENCOUNTER — Encounter: Payer: Self-pay | Admitting: Rehabilitative and Restorative Service Providers"

## 2019-07-08 ENCOUNTER — Other Ambulatory Visit: Payer: Self-pay

## 2019-07-08 ENCOUNTER — Ambulatory Visit (INDEPENDENT_AMBULATORY_CARE_PROVIDER_SITE_OTHER): Payer: BC Managed Care – PPO

## 2019-07-08 DIAGNOSIS — N83202 Unspecified ovarian cyst, left side: Secondary | ICD-10-CM | POA: Diagnosis not present

## 2019-07-08 DIAGNOSIS — R102 Pelvic and perineal pain: Secondary | ICD-10-CM | POA: Diagnosis not present

## 2019-07-08 DIAGNOSIS — R42 Dizziness and giddiness: Secondary | ICD-10-CM

## 2019-07-08 DIAGNOSIS — H8111 Benign paroxysmal vertigo, right ear: Secondary | ICD-10-CM

## 2019-07-08 NOTE — Therapy (Signed)
[address] [place], [place], [address] Phone: [phone]   Fax:  [phone]  Physical Therapy Treatment and Discharge Summary  Patient Details  Name: Jennifer Jacobson MRN: [id number] Date of Birth: 05/26/1981 Referring Provider (PT): Silverio Decamp, MD   Encounter Date: 07/08/2019  PT End of Session - 07/08/19 0756    Visit Number  3    Number of Visits  4    Date for PT Re-Evaluation  07/10/19    Authorization Type  BCBS    PT Start Time  0729    PT Stop Time  0755    PT Time Calculation (min)  26 min    Activity Tolerance  Patient tolerated treatment well    Behavior During Therapy  [facility] for tasks assessed/performed       Past Medical History:  Diagnosis Date  . Kidney stones   . Migraine   . Vaginal Pap smear, abnormal     Past Surgical History:  Procedure Laterality Date  . CESAREAN SECTION    . COLPOSCOPY      There were no vitals filed for this visit.  Subjective Assessment - 07/08/19 0729    Subjective  The patient reports her dizziness was gone for 2 weeks and then returned.  She notes she feels things get worse when she is stressed.  She notes bending forward provokes symtpoms.  Pt arrives 15 minutes late to appt today.    Pertinent History  migraines (dizziness is worse during migraine, but can have dizziness without migraine as well).    Patient Stated Goals  reduce vertigo    Currently in Pain?  No/denies             Vestibular Assessment - 07/08/19 0730      Positional Testing   Dix-Hallpike  Dix-Hallpike Right;Dix-Hallpike Left    Sidelying Test  Sidelying Right;Sidelying Left    Horizontal Canal Testing  Horizontal Canal Right;Horizontal Canal Left      Dix-Hallpike Right   Dix-Hallpike Right Duration  trace nystagmus viewed in room light x 5 seconds    Dix-Hallpike Right Symptoms  Upbeat, right rotatory nystagmus      Dix-Hallpike Left   Dix-Hallpike Left Duration  none    Dix-Hallpike Left Symptoms  No nystagmus      Sidelying Right   Sidelying Right Duration  none    Sidelying Right Symptoms  No nystagmus      Sidelying Left   Sidelying Left Duration  trace subjective, no nystagmus viewed in room light    Sidelying Left Symptoms  No nystagmus      Horizontal Canal Right   Horizontal Canal Right Duration  none    Horizontal Canal Right Symptoms  Normal      Horizontal Canal Left   Horizontal Canal Left Duration  none    Horizontal Canal Left Symptoms  Normal               [facility] Adult PT Treatment/Exercise - 07/08/19 0001      Neuro Re-ed    Neuro Re-ed Details   Compliant surface standing with head motion horizontal and vertical and compliant surfaces + eyes closed      Vestibular Treatment/Exercise - 07/08/19 0756      Vestibular Treatment/Exercise   Vestibular Treatment Provided  Canalith Repositioning;Habituation;Gaze    Canalith Repositioning  Epley Manuever Right    Habituation Exercises  Nestor Lewandowsky    Gaze Exercises  X1  Viewing Horizontal       EPLEY MANUEVER RIGHT   Number of Reps   2    Overall Response  Symptoms Resolved    Response Details   no nystagmus or dizziness on 2nd rep; instructed in self epley's maneuver      Nestor Lewandowsky   Number of Reps   2    Symptom Description   reviewed and discussed for home program self mgmt      X1 Viewing Horizontal   Foot Position  standing feet apart    Comments  60 seconds with cues on increasing speed      X1 Viewing Vertical   Foot Position  standing gaze    Comments  no difficulty with vertical VOR            PT Education - 07/08/19 0756    Education Details  reviewed HEP and added self treatment with Epley's    Person(s) Educated  Patient    Methods  Explanation;Demonstration;Handout    Comprehension  Verbalized understanding;Returned demonstration          PT Long Term Goals - 07/08/19 0758      PT LONG TERM GOAL  #1   Title  The patient will have negative R dix hallpike for nystagmus and subjective report of dizziness.    Baseline  first rep has <5 seconds nystagmus with brief duration subjective sensation of vertigo.    Time  4    Period  Weeks    Status  Partially Met      PT LONG TERM GOAL #2   Title  The patient will report no dizziness with forward bending or rising in the morning.    Baseline  achieved x 2 weeks, then returned over the weekend    Time  4    Period  Weeks    Status  Partially Met      PT LONG TERM GOAL #3   Title  The patient will be indep with self mgmt of vertigo due to h/o recurring episodic vertigo.    Time  4    Period  Weeks    Status  Achieved            Plan - 07/08/19 0758    Clinical Impression Statement  The patient has partially met LTGs. She had resolution of symptoms x 2 weeks and then a return of trace BPPV.  PT instructed her in self mgmt using home epley's, habituation and encouraged continued performance of gaze adaptation and balance as these are still indicated.    PT Treatment/Interventions  ADLs/Self Care Home Management;Canalith Repostioning;Vestibular;Patient/family education;Neuromuscular re-education;Gait training    PT Next Visit Plan  discharge    PT Home Exercise Plan  Access Code: [email or web address]    Consulted and Agree with Plan of Care  Patient       Patient will benefit from skilled therapeutic intervention in order to improve the following deficits and impairments:  Dizziness, Decreased balance, Decreased activity tolerance  Visit Diagnosis: BPPV (benign paroxysmal positional vertigo), right  Dizziness and giddiness     Problem List Patient Active Problem List   Diagnosis Date Noted  . Acute low back pain 01/03/2019  . Benign positional vertigo 01/03/2019  . Tonsillolith 01/03/2019  . Marital conflict 71/12/2692  . Overweight 07/19/2018  . 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 Right cervical radiculopathy  06/20/2018  . Right hip pain 06/20/2018  . Frequent headaches 06/20/2018   PHYSICAL THERAPY DISCHARGE  SUMMARY  Visits from Start of Care: 3  Current functional level related to goals / functional outcomes: See above   Remaining deficits: Trace BPPV that resolves with 1 rep of epley's maneuver   Education / Equipment: Home program, self mgmt of recurring BPPV.  Plan: Patient agrees to discharge.  Patient goals were met. Patient is being discharged due to meeting the stated rehab goals.  ?????        Thank you for the referral of this patient. Rudell Cobb, MPT  Yamile Roedl 07/08/2019, 9:13 AM  [facility] [address] [place], [place], [address] Phone: [phone]   Fax:  [phone]  Name: Jennifer Jacobson MRN: [id number] Date of Birth: July 12, 1981

## 2019-07-08 NOTE — Patient Instructions (Signed)
Access Code: [email or web address]  URL: [email or web address]  Date: 07/08/2019  Prepared by: Rudell Cobb   Exercises Self-Epley Maneuver Right Ear - 1 reps - 1 sets - 2x daily - 7x weekly Brandt-Daroff Vestibular Exercise - 5 reps - 1 sets - 2x daily - 7x weekly Standing Gaze Stabilization with Head Rotation - 1 reps - 2 sets - 3x daily - 7x weekly Wide Stance with Eyes Closed on Foam Pad - 3 reps - 1 sets - 30 seconds hold - 2x daily - 7x weekly Wide Stance with Head Rotation on Foam Pad - 10 reps - 3 sets - 2x daily - 7x weekly Wide Stance with Head Nods on Foam Pad - 10 reps - 1 sets - 2x daily - 7x weekly

## 2019-07-22 ENCOUNTER — Encounter (INDEPENDENT_AMBULATORY_CARE_PROVIDER_SITE_OTHER): Payer: Self-pay

## 2019-07-24 DIAGNOSIS — M545 Low back pain, unspecified: Secondary | ICD-10-CM

## 2019-07-25 MED ORDER — PREDNISONE 50 MG PO TABS: 50.0000 mg | ORAL_TABLET | Freq: Every day | ORAL | 0 refills | Status: DC

## 2019-07-25 NOTE — Assessment & Plan Note (Signed)
Responded well to prednisone back in the summertime, now having recurrence of pain, adding another course of prednisone, she will follow up with me if no better.

## 2019-08-18 IMAGING — DX LUMBAR SPINE - COMPLETE 4+ VIEW
5 series · 5 of 5 positions shown · non-contrast
Comparison: None

CLINICAL DATA: LEFT side low back pain for 10 days without sciatica

EXAM:
LUMBAR SPINE - COMPLETE 4+ VIEW

[l-spine ap]
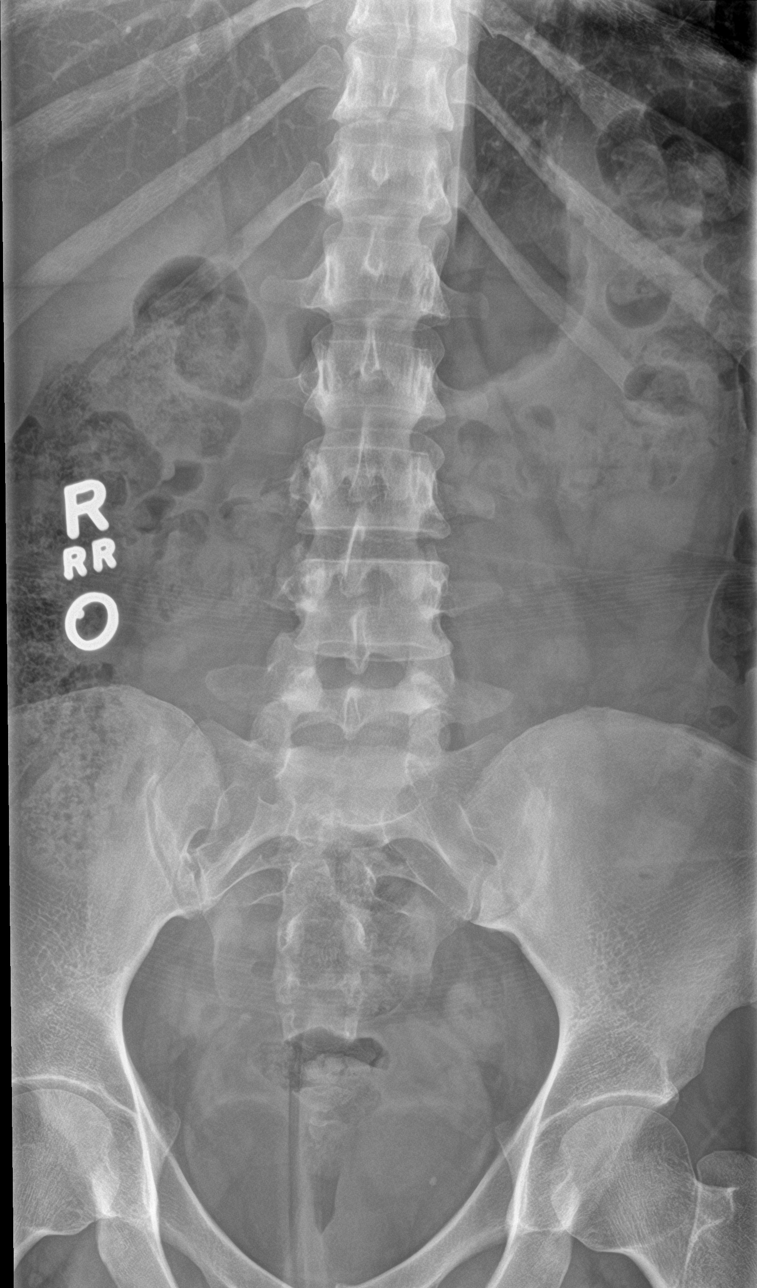

[l-spine obl (1 of 2)]
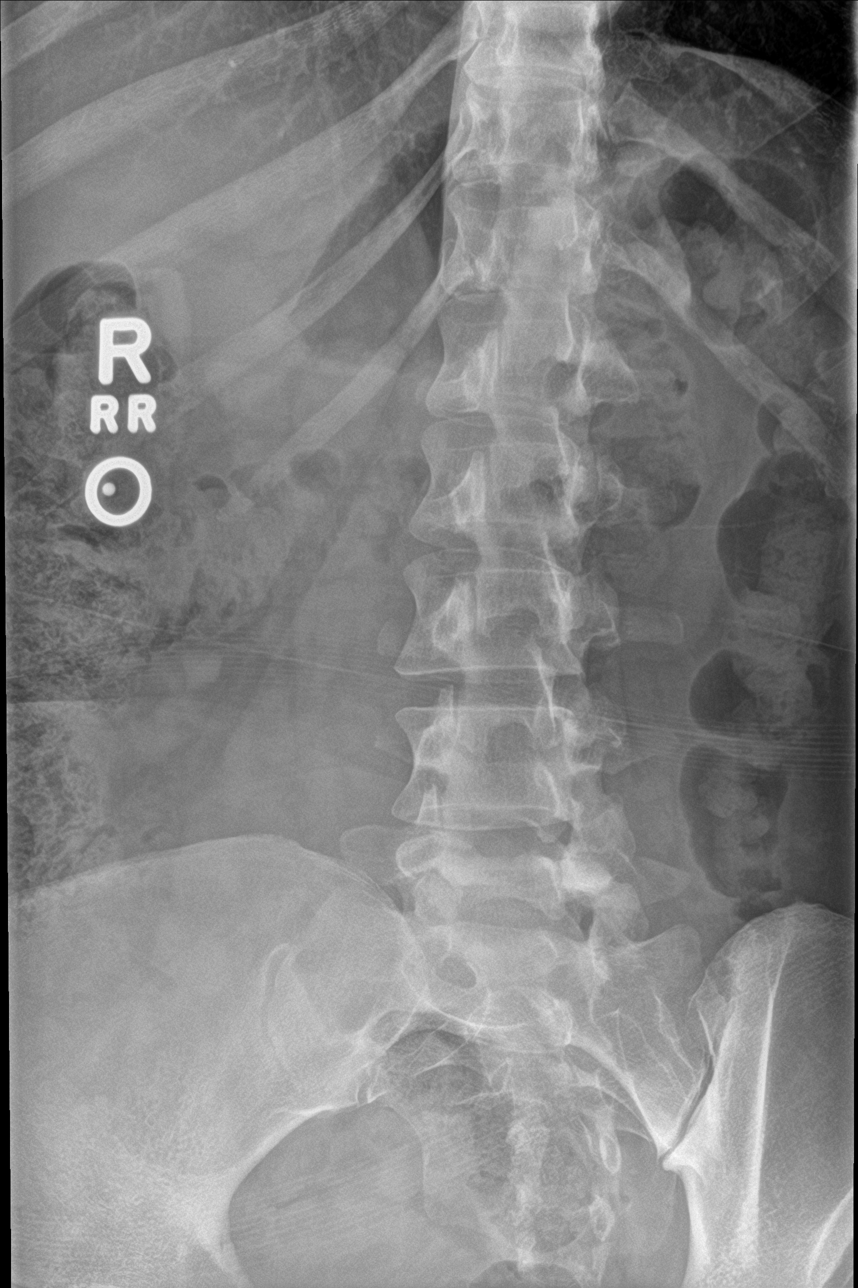

[l-spine obl (2 of 2)]
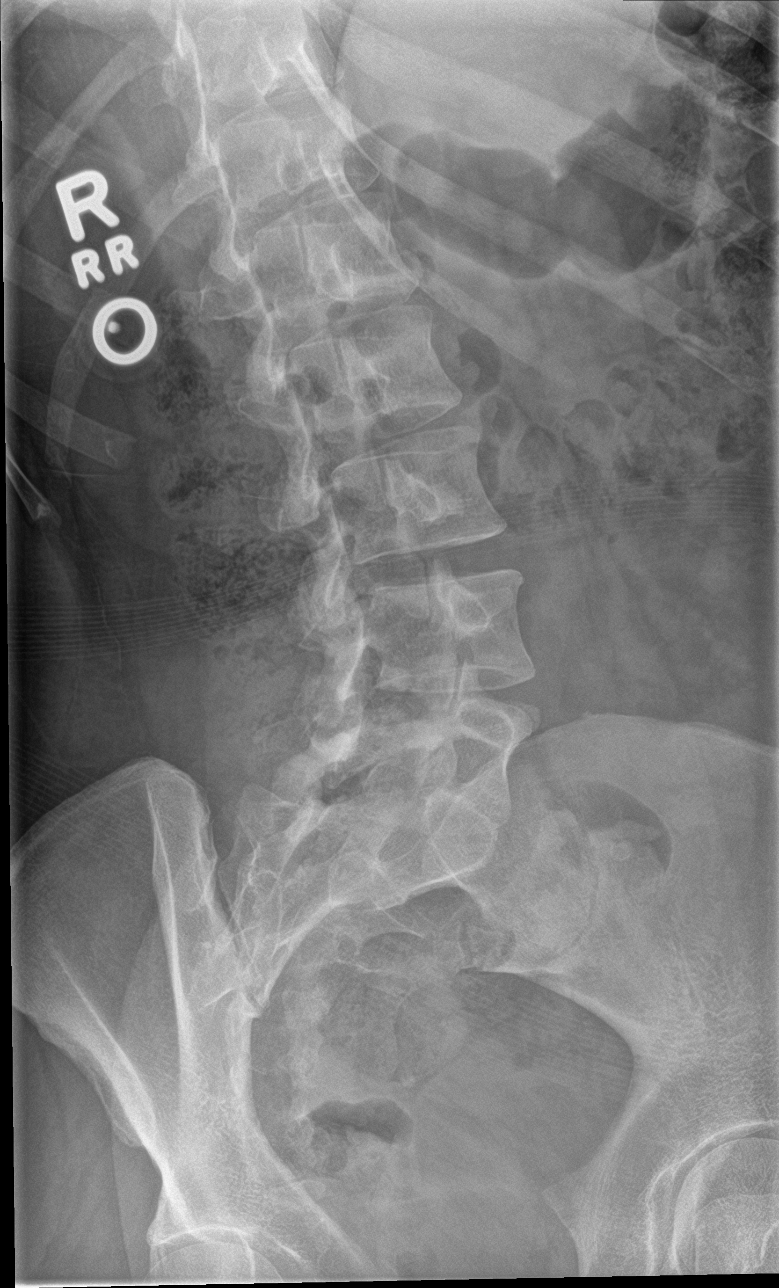

[l-spine lat]
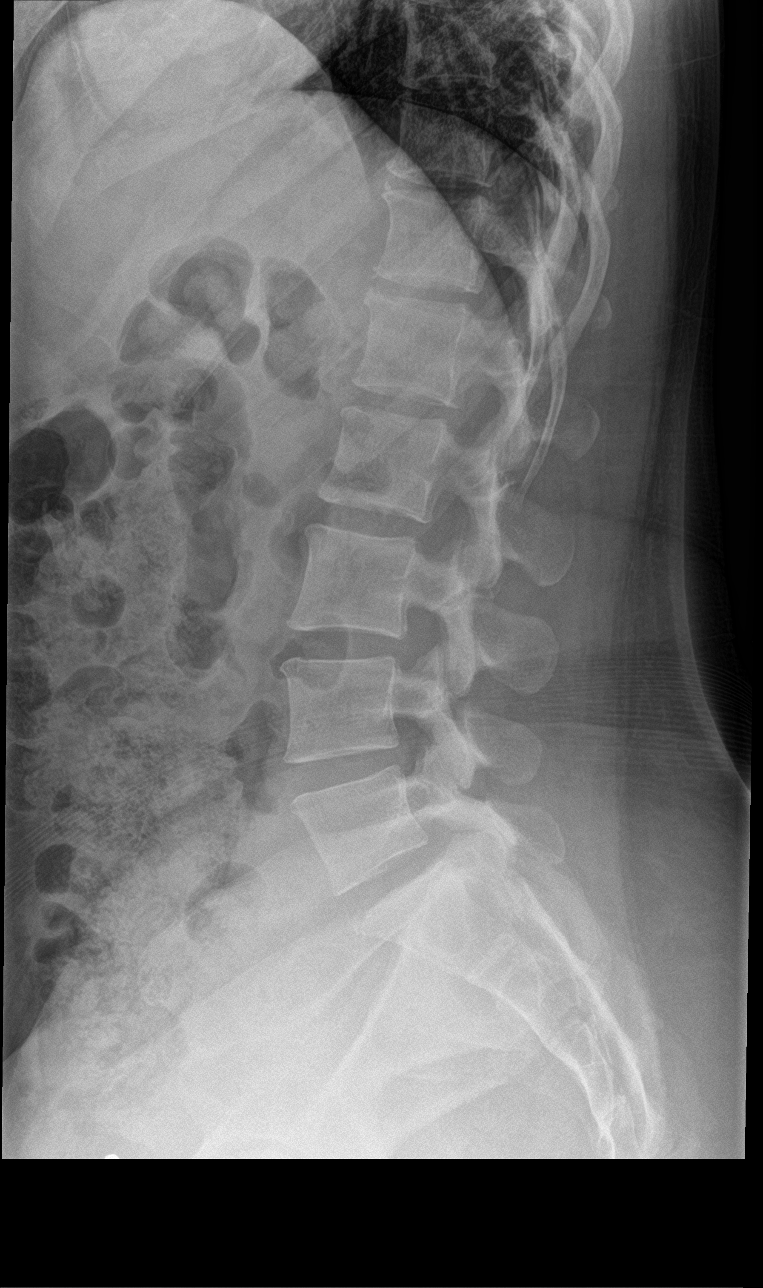

[l-spine spot]
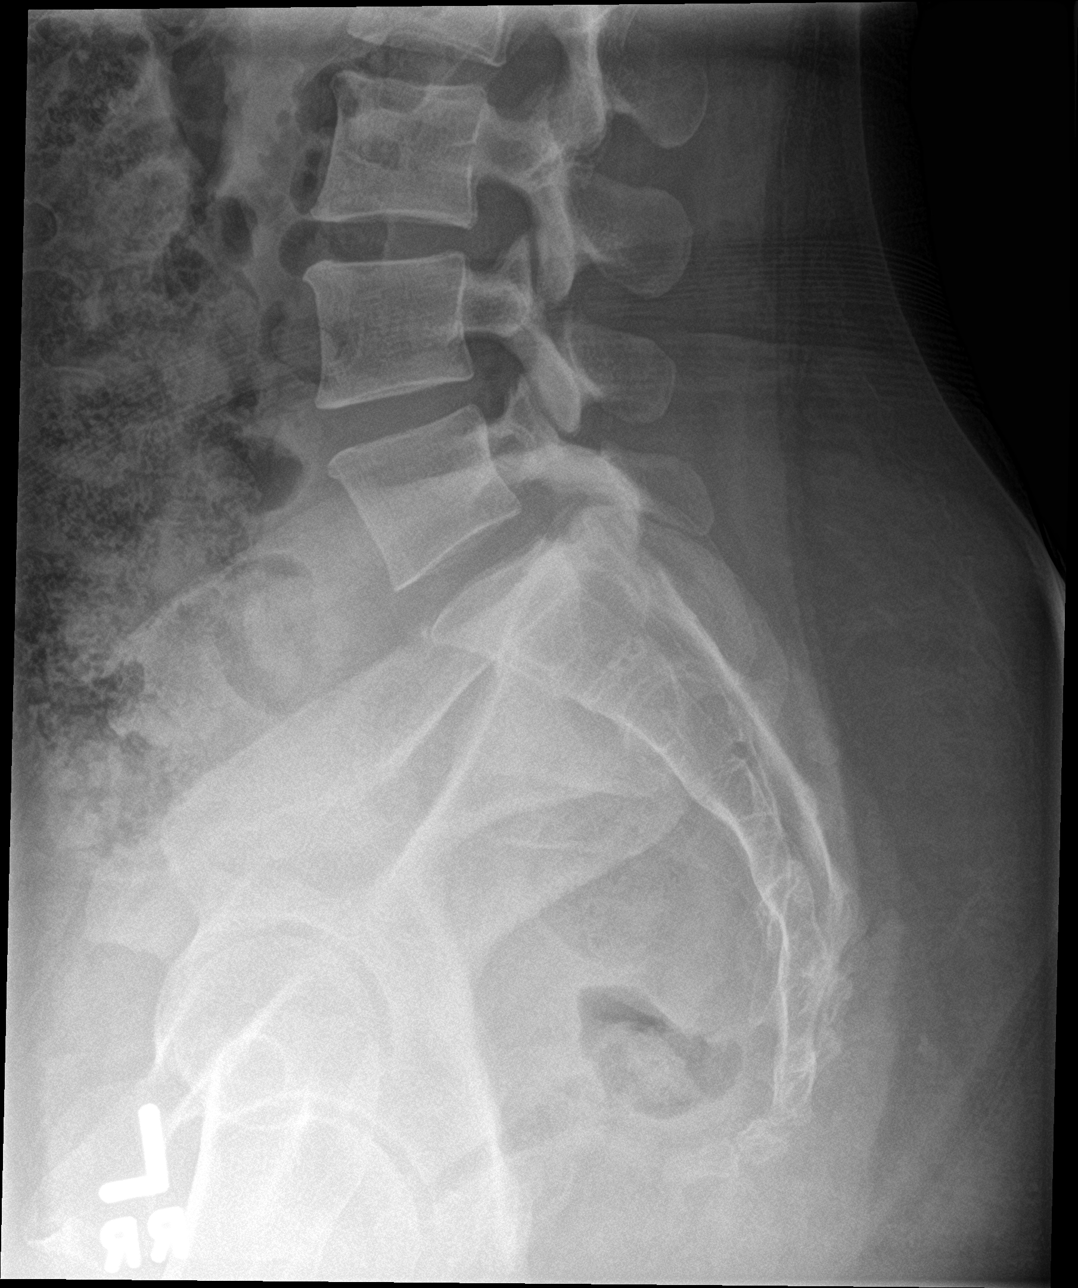

[5 of 5 positions shown; findings below may reference images not displayed]

FINDINGS: Osseous mineralization normal.

Vertebral body and disc space heights maintained.

No acute fracture, subluxation, or bone destruction.

No spondylolysis.

SI joints slightly asymmetric, slightly narrowed and question mildly
sclerotic on LEFT.
IMPRESSION: No acute lumbar spine abnormalities.

Question asymmetric LEFT sacroiliitis.

## 2019-10-15 DIAGNOSIS — O09521 Supervision of elderly multigravida, first trimester: Secondary | ICD-10-CM | POA: Diagnosis not present

## 2019-10-15 DIAGNOSIS — O3680X0 Pregnancy with inconclusive fetal viability, not applicable or unspecified: Secondary | ICD-10-CM | POA: Diagnosis not present

## 2019-10-15 DIAGNOSIS — Z3201 Encounter for pregnancy test, result positive: Secondary | ICD-10-CM | POA: Diagnosis not present

## 2019-10-15 DIAGNOSIS — N9489 Other specified conditions associated with female genital organs and menstrual cycle: Secondary | ICD-10-CM | POA: Diagnosis not present

## 2019-10-30 DIAGNOSIS — O09521 Supervision of elderly multigravida, first trimester: Secondary | ICD-10-CM | POA: Diagnosis not present

## 2019-11-12 DIAGNOSIS — Z3480 Encounter for supervision of other normal pregnancy, unspecified trimester: Secondary | ICD-10-CM | POA: Diagnosis not present

## 2019-11-12 DIAGNOSIS — Z8759 Personal history of other complications of pregnancy, childbirth and the puerperium: Secondary | ICD-10-CM | POA: Diagnosis not present

## 2019-11-12 DIAGNOSIS — Z6829 Body mass index (BMI) 29.0-29.9, adult: Secondary | ICD-10-CM | POA: Diagnosis not present

## 2019-11-26 DIAGNOSIS — F321 Major depressive disorder, single episode, moderate: Secondary | ICD-10-CM | POA: Diagnosis not present

## 2019-12-27 DIAGNOSIS — Z3689 Encounter for other specified antenatal screening: Secondary | ICD-10-CM | POA: Diagnosis not present

## 2019-12-27 DIAGNOSIS — O2340 Unspecified infection of urinary tract in pregnancy, unspecified trimester: Secondary | ICD-10-CM | POA: Diagnosis not present

## 2020-01-21 ENCOUNTER — Ambulatory Visit (INDEPENDENT_AMBULATORY_CARE_PROVIDER_SITE_OTHER): Payer: BC Managed Care – PPO | Admitting: Sports Medicine

## 2020-01-21 DIAGNOSIS — G5601 Carpal tunnel syndrome, right upper limb: Secondary | ICD-10-CM

## 2020-01-21 NOTE — Progress Notes (Signed)
    Procedures performed today:    None.  Independent interpretation of notes and tests performed by another provider:   None.  Brief History, Exam, Impression, and Recommendations:    Carpal tunnel syndrome, right This is a pleasant 39 year old female, she is in her third trimester. She has developed numbness and tingling in her right hand, first, second, third, and half of the fourth finger, typical distribution, worse at night. On exam she has a positive carpal tunnel Tinel's sign. She will wear night splint for a month, do carpal tunnel rehab exercises and return in a month, if insufficient improvement we will proceed with median nerve Hydro dissection.    ___________________________________________ Ihor Austin. Benjamin Stain, M.D., ABFM., CAQSM. Primary Care and Sports Medicine [facility]  Adjunct Instructor of Family Medicine  [employer]

## 2020-01-21 NOTE — Assessment & Plan Note (Signed)
This is a pleasant 39 year old female, she is in her third trimester. She has developed numbness and tingling in her right hand, first, second, third, and half of the fourth finger, typical distribution, worse at night. On exam she has a positive carpal tunnel Tinel's sign. She will wear night splint for a month, do carpal tunnel rehab exercises and return in a month, if insufficient improvement we will proceed with median nerve Hydro dissection.

## 2020-01-23 ENCOUNTER — Ambulatory Visit: Payer: BC Managed Care – PPO | Admitting: Sports Medicine

## 2020-02-12 DIAGNOSIS — Z3492 Encounter for supervision of normal pregnancy, unspecified, second trimester: Secondary | ICD-10-CM | POA: Diagnosis not present

## 2020-02-12 DIAGNOSIS — Z23 Encounter for immunization: Secondary | ICD-10-CM | POA: Diagnosis not present

## 2020-02-18 ENCOUNTER — Other Ambulatory Visit: Payer: Self-pay

## 2020-02-18 ENCOUNTER — Ambulatory Visit (INDEPENDENT_AMBULATORY_CARE_PROVIDER_SITE_OTHER): Payer: BC Managed Care – PPO | Admitting: Sports Medicine

## 2020-02-18 DIAGNOSIS — G5601 Carpal tunnel syndrome, right upper limb: Secondary | ICD-10-CM

## 2020-02-18 NOTE — Progress Notes (Signed)
° ° °  Procedures performed today:    Procedure: Real-time Ultrasound Guided hydrodissection of the right median nerve at the carpal tunnel Device: Samsung HS60 Verbal informed consent obtained.  Time-out conducted.  Noted no overlying erythema, induration, or other signs of local infection.  Skin prepped in a sterile fashion.  Local anesthesia: Topical Ethyl chloride.  With sterile technique and under real time ultrasound guidance: Using a 25-gauge needle advanced into the carpal tunnel, taking care to avoid intraneural injection I injected medication both superficial to and deep to the median nerve freeing it from surrounding structures, I then redirected the needle deep and injected further medication around the flexor tendons deep within the carpal tunnel for a total of 1 cc kenalog 40, 5 cc 1% lidocaine without epinephrine. Completed without difficulty  Advised to call if fevers/chills, erythema, induration, drainage, or persistent bleeding.  Images permanently stored and available for review in the ultrasound unit.  Impression: Technically successful ultrasound guided median nerve hydrodissection.  Independent interpretation of notes and tests performed by another provider:   None.  Brief History, Exam, Impression, and Recommendations:    Carpal tunnel syndrome, right This is a very pleasant 39 year old female, she is [redacted] weeks pregnant now, she had developed numbness and tingling in her right hand, consistent with carpal tunnel syndrome, we have tried night splinting, unfortunately she has persistent symptoms, now starting to have some symptoms on the left side as well. Due to failure of conservative treatment on the right we did a median nerve Hydro dissection today. I have advised her to start wearing a splint on the left side as well, she should continue right-sided splinting, return to see me in 1 month, if no better on the left side we will do an additional median nerve  hydrodissection here.    ___________________________________________ Ihor Austin. Benjamin Stain, M.D., ABFM., CAQSM. Primary Care and Sports Medicine [facility]  Adjunct Instructor of Family Medicine  [employer]

## 2020-02-18 NOTE — Assessment & Plan Note (Signed)
This is a very pleasant 39 year old female, she is [redacted] weeks pregnant now, she had developed numbness and tingling in her right hand, consistent with carpal tunnel syndrome, we have tried night splinting, unfortunately she has persistent symptoms, now starting to have some symptoms on the left side as well. Due to failure of conservative treatment on the right we did a median nerve Hydro dissection today. I have advised her to start wearing a splint on the left side as well, she should continue right-sided splinting, return to see me in 1 month, if no better on the left side we will do an additional median nerve hydrodissection here.

## 2020-03-17 ENCOUNTER — Ambulatory Visit: Payer: BC Managed Care – PPO | Admitting: Sports Medicine

## 2020-04-23 DIAGNOSIS — Z3480 Encounter for supervision of other normal pregnancy, unspecified trimester: Secondary | ICD-10-CM | POA: Diagnosis not present

## 2020-04-23 DIAGNOSIS — O368130 Decreased fetal movements, third trimester, not applicable or unspecified: Secondary | ICD-10-CM | POA: Diagnosis not present

## 2020-04-23 DIAGNOSIS — O368190 Decreased fetal movements, unspecified trimester, not applicable or unspecified: Secondary | ICD-10-CM | POA: Diagnosis not present

## 2020-05-06 DIAGNOSIS — Z3A39 39 weeks gestation of pregnancy: Secondary | ICD-10-CM | POA: Diagnosis not present

## 2020-05-06 DIAGNOSIS — O99354 Diseases of the nervous system complicating childbirth: Secondary | ICD-10-CM | POA: Diagnosis not present

## 2020-05-06 DIAGNOSIS — O4202 Full-term premature rupture of membranes, onset of labor within 24 hours of rupture: Secondary | ICD-10-CM | POA: Diagnosis not present

## 2020-05-06 DIAGNOSIS — O34219 Maternal care for unspecified type scar from previous cesarean delivery: Secondary | ICD-10-CM | POA: Diagnosis not present

## 2020-05-06 DIAGNOSIS — K219 Gastro-esophageal reflux disease without esophagitis: Secondary | ICD-10-CM | POA: Diagnosis not present

## 2020-05-06 DIAGNOSIS — Z87891 Personal history of nicotine dependence: Secondary | ICD-10-CM | POA: Diagnosis not present

## 2020-05-06 DIAGNOSIS — Z202 Contact with and (suspected) exposure to infections with a predominantly sexual mode of transmission: Secondary | ICD-10-CM | POA: Diagnosis not present

## 2020-05-06 DIAGNOSIS — O34211 Maternal care for low transverse scar from previous cesarean delivery: Secondary | ICD-10-CM | POA: Diagnosis not present

## 2020-05-06 DIAGNOSIS — A609 Anogenital herpesviral infection, unspecified: Secondary | ICD-10-CM | POA: Diagnosis not present

## 2020-05-06 DIAGNOSIS — O98319 Other infections with a predominantly sexual mode of transmission complicating pregnancy, unspecified trimester: Secondary | ICD-10-CM | POA: Diagnosis not present

## 2020-05-06 DIAGNOSIS — O9962 Diseases of the digestive system complicating childbirth: Secondary | ICD-10-CM | POA: Diagnosis not present

## 2020-05-06 DIAGNOSIS — O9081 Anemia of the puerperium: Secondary | ICD-10-CM | POA: Diagnosis not present

## 2020-05-06 DIAGNOSIS — D649 Anemia, unspecified: Secondary | ICD-10-CM | POA: Diagnosis not present

## 2020-05-06 DIAGNOSIS — M5412 Radiculopathy, cervical region: Secondary | ICD-10-CM | POA: Diagnosis not present

## 2020-05-06 DIAGNOSIS — O1092 Unspecified pre-existing hypertension complicating childbirth: Secondary | ICD-10-CM | POA: Diagnosis not present

## 2020-05-06 DIAGNOSIS — Z23 Encounter for immunization: Secondary | ICD-10-CM | POA: Diagnosis not present

## 2020-05-19 DIAGNOSIS — R300 Dysuria: Secondary | ICD-10-CM | POA: Diagnosis not present

## 2020-06-22 DIAGNOSIS — Z309 Encounter for contraceptive management, unspecified: Secondary | ICD-10-CM | POA: Diagnosis not present

## 2020-06-22 DIAGNOSIS — Z98891 History of uterine scar from previous surgery: Secondary | ICD-10-CM | POA: Diagnosis not present

## 2020-06-22 DIAGNOSIS — N898 Other specified noninflammatory disorders of vagina: Secondary | ICD-10-CM | POA: Diagnosis not present

## 2020-06-23 DIAGNOSIS — H5213 Myopia, bilateral: Secondary | ICD-10-CM | POA: Diagnosis not present

## 2020-09-03 ENCOUNTER — Other Ambulatory Visit: Payer: Self-pay

## 2020-09-03 ENCOUNTER — Ambulatory Visit (INDEPENDENT_AMBULATORY_CARE_PROVIDER_SITE_OTHER): Payer: BC Managed Care – PPO | Admitting: Sports Medicine

## 2020-09-03 ENCOUNTER — Ambulatory Visit (INDEPENDENT_AMBULATORY_CARE_PROVIDER_SITE_OTHER): Payer: BC Managed Care – PPO

## 2020-09-03 DIAGNOSIS — M545 Low back pain, unspecified: Secondary | ICD-10-CM | POA: Diagnosis not present

## 2020-09-03 DIAGNOSIS — G5601 Carpal tunnel syndrome, right upper limb: Secondary | ICD-10-CM

## 2020-09-03 NOTE — Assessment & Plan Note (Signed)
This is a very pleasant 40 year old female, she is 4 months postpartum, back when she was approximately [redacted] weeks pregnant we did a median nerve hydrodissection, this was back in August of last year. She had fantastic relief. Unfortunately she is having recurrence of carpal tunnel symptoms on the right. Carpal tunnel syndrome can persist for several months after delivery of the baby, repeat right median nerve hydrodissection, this will probably be the last one that we need to do.

## 2020-09-03 NOTE — Progress Notes (Signed)
    Procedures performed today:    Procedure: Real-time Ultrasound Guided hydrodissection of the right median nerve at the carpal tunnel Device: Samsung HS60 Verbal informed consent obtained.  Time-out conducted.  Noted no overlying erythema, induration, or other signs of local infection.  Skin prepped in a sterile fashion.  Local anesthesia: Topical Ethyl chloride.  With sterile technique and under real time ultrasound guidance: Noted minimally enlarged median nerve.  Using a 25-gauge needle advanced into the carpal tunnel, taking care to avoid intraneural injection I injected medication both superficial to and deep to the median nerve freeing it from surrounding structures, I then redirected the needle deep and injected further medication around the flexor tendons deep within the carpal tunnel for a total of 1 cc kenalog 40, 5 cc 1% lidocaine without epinephrine. Completed without difficulty  Advised to call if fevers/chills, erythema, induration, drainage, or persistent bleeding.  Images permanently stored and available for review in PACS.  Impression: Technically successful ultrasound guided median nerve hydrodissection.  Independent interpretation of notes and tests performed by another provider:   None.  Brief History, Exam, Impression, and Recommendations:    Carpal tunnel syndrome, right This is a very pleasant 40 year old female, she is 4 months postpartum, back when she was approximately [redacted] weeks pregnant we did a median nerve hydrodissection, this was back in August of last year. She had fantastic relief. Unfortunately she is having recurrence of carpal tunnel symptoms on the right. Carpal tunnel syndrome can persist for several months after delivery of the baby, repeat right median nerve hydrodissection, this will probably be the last one that we need to do.  Acute low back pain She also has left-sided low back pain, she localizes this to the left sacroiliac joint worse with  weightbearing on the ipsilateral side, x-rays did show some sclerosis of the SI joint consistent with mild sacroiliitis. We will start with conservative treatment, and if insufficient improvement over the next month I will inject her left sacroiliac joint.    ___________________________________________ Ihor Austin. Benjamin Stain, M.D., ABFM., CAQSM. Primary Care and Sports Medicine [facility] [facility]  Adjunct Instructor of Family Medicine  [employer]

## 2020-09-03 NOTE — Assessment & Plan Note (Signed)
She also has left-sided low back pain, she localizes this to the left sacroiliac joint worse with weightbearing on the ipsilateral side, x-rays did show some sclerosis of the SI joint consistent with mild sacroiliitis. We will start with conservative treatment, and if insufficient improvement over the next month I will inject her left sacroiliac joint.

## 2020-09-08 MED ORDER — TRAMADOL HCL 50 MG PO TABS: 50.0000 mg | ORAL_TABLET | Freq: Three times a day (TID) | ORAL | 0 refills | Status: DC | PRN

## 2020-09-30 DIAGNOSIS — Z202 Contact with and (suspected) exposure to infections with a predominantly sexual mode of transmission: Secondary | ICD-10-CM | POA: Diagnosis not present

## 2020-09-30 DIAGNOSIS — R300 Dysuria: Secondary | ICD-10-CM | POA: Diagnosis not present

## 2020-10-01 ENCOUNTER — Ambulatory Visit: Payer: BC Managed Care – PPO | Admitting: Sports Medicine

## 2020-10-05 ENCOUNTER — Ambulatory Visit: Payer: BC Managed Care – PPO | Admitting: Sports Medicine

## 2020-10-07 ENCOUNTER — Ambulatory Visit (INDEPENDENT_AMBULATORY_CARE_PROVIDER_SITE_OTHER): Payer: BC Managed Care – PPO | Admitting: Sports Medicine

## 2020-10-07 ENCOUNTER — Other Ambulatory Visit: Payer: Self-pay

## 2020-10-07 DIAGNOSIS — R221 Localized swelling, mass and lump, neck: Secondary | ICD-10-CM

## 2020-10-07 DIAGNOSIS — M545 Low back pain, unspecified: Secondary | ICD-10-CM

## 2020-10-07 DIAGNOSIS — G5601 Carpal tunnel syndrome, right upper limb: Secondary | ICD-10-CM | POA: Diagnosis not present

## 2020-10-07 NOTE — Assessment & Plan Note (Signed)
There has been some subjective neck fullness, on exam she is for the most part normal, no symptoms of hypo or hyperthyroidism. However considering her findings we will pull the trigger for thyroid ultrasound.

## 2020-10-07 NOTE — Assessment & Plan Note (Signed)
Left-sided low back pain continues, albeit a little better today. This was localized to the left sacroiliac joint worse with weightbearing, x-rays did show sclerosis of the SI joint consistent with mild sacroiliitis. She would like to do another month or 6 weeks of conservative treatment, she does admit she has not been diligent with the rehabilitation exercises, and if insufficient improvement after 4 to 6 weeks we will do SI joint injection on the left.

## 2020-10-07 NOTE — Assessment & Plan Note (Signed)
Essentially resolved after median nerve Hydro dissection on the right at the last visit.

## 2020-10-07 NOTE — Progress Notes (Signed)
    Procedures performed today:    None.  Independent interpretation of notes and tests performed by another provider:   None.  Brief History, Exam, Impression, and Recommendations:    Acute low back pain Left-sided low back pain continues, albeit a little better today. This was localized to the left sacroiliac joint worse with weightbearing, x-rays did show sclerosis of the SI joint consistent with mild sacroiliitis. She would like to do another month or 6 weeks of conservative treatment, she does admit she has not been diligent with the rehabilitation exercises, and if insufficient improvement after 4 to 6 weeks we will do SI joint injection on the left.  Carpal tunnel syndrome, right Essentially resolved after median nerve Hydro dissection on the right at the last visit.  Neck fullness There has been some subjective neck fullness, on exam she is for the most part normal, no symptoms of hypo or hyperthyroidism. However considering her findings we will pull the trigger for thyroid ultrasound.    ___________________________________________ Jennifer Jacobson. Benjamin Stain, M.D., ABFM., CAQSM. Primary Care and Sports Medicine [place] [facility] [facility]  Adjunct Instructor of Family Medicine  [employer]

## 2020-10-12 ENCOUNTER — Other Ambulatory Visit: Payer: Self-pay

## 2020-10-12 ENCOUNTER — Ambulatory Visit (INDEPENDENT_AMBULATORY_CARE_PROVIDER_SITE_OTHER): Payer: BC Managed Care – PPO

## 2020-10-12 DIAGNOSIS — R221 Localized swelling, mass and lump, neck: Secondary | ICD-10-CM

## 2020-11-18 ENCOUNTER — Ambulatory Visit: Payer: BC Managed Care – PPO | Admitting: Sports Medicine

## 2021-07-27 ENCOUNTER — Ambulatory Visit (INDEPENDENT_AMBULATORY_CARE_PROVIDER_SITE_OTHER): Payer: BC Managed Care – PPO

## 2021-07-27 ENCOUNTER — Ambulatory Visit (INDEPENDENT_AMBULATORY_CARE_PROVIDER_SITE_OTHER): Payer: BC Managed Care – PPO | Admitting: Sports Medicine

## 2021-07-27 ENCOUNTER — Other Ambulatory Visit: Payer: Self-pay

## 2021-07-27 DIAGNOSIS — G5601 Carpal tunnel syndrome, right upper limb: Secondary | ICD-10-CM | POA: Diagnosis not present

## 2021-07-27 DIAGNOSIS — M7711 Lateral epicondylitis, right elbow: Secondary | ICD-10-CM | POA: Diagnosis not present

## 2021-07-27 NOTE — Progress Notes (Signed)
° ° °  Procedures performed today:    Procedure: Real-time Ultrasound Guided hydrodissection of the right median nerve at the carpal tunnel Device: Samsung HS60 Verbal informed consent obtained.  Time-out conducted.  Noted no overlying erythema, induration, or other signs of local infection.  Skin prepped in a sterile fashion.  Local anesthesia: Topical Ethyl chloride.  With sterile technique and under real time ultrasound guidance: Noted enlarged nerve, using a 25-gauge needle advanced into the carpal tunnel, taking care to avoid intraneural injection I injected medication both superficial to and deep to the median nerve freeing it from surrounding structures, I then redirected the needle deep and injected further medication around the flexor tendons deep within the carpal tunnel for a total of 1 cc kenalog 40, 5 cc 1% lidocaine without epinephrine. Completed without difficulty  Advised to call if fevers/chills, erythema, induration, drainage, or persistent bleeding.  Images permanently stored and available for review in PACS.  Impression: Technically successful ultrasound guided median nerve hydrodissection.  Independent interpretation of notes and tests performed by another provider:   None.  Brief History, Exam, Impression, and Recommendations:    Carpal tunnel syndrome, right Known carpal tunnel syndrome, last hydrodissection was in February of last year, recurrence of symptoms so repeated today.  Lateral epicondylitis, right elbow Chronic right lateral elbow pain, worse over the lateral epicondyle. Adding tennis elbow conditioning, return to see me in 4 to 6 weeks as needed for this.    ___________________________________________ Ihor Austin. Benjamin Stain, M.D., ABFM., CAQSM. Primary Care and Sports Medicine [place] [facility]  Adjunct Instructor of Family Medicine  [employer]

## 2021-07-27 NOTE — Assessment & Plan Note (Signed)
Known carpal tunnel syndrome, last hydrodissection was in February of last year, recurrence of symptoms so repeated today.

## 2021-07-27 NOTE — Assessment & Plan Note (Signed)
Chronic right lateral elbow pain, worse over the lateral epicondyle. Adding tennis elbow conditioning, return to see me in 4 to 6 weeks as needed for this.

## 2021-08-24 ENCOUNTER — Ambulatory Visit: Payer: BC Managed Care – PPO | Admitting: Sports Medicine

## 2021-08-24 DIAGNOSIS — F4322 Adjustment disorder with anxiety: Secondary | ICD-10-CM | POA: Diagnosis not present

## 2021-08-25 ENCOUNTER — Encounter: Payer: Self-pay | Admitting: Sports Medicine

## 2021-08-25 DIAGNOSIS — H109 Unspecified conjunctivitis: Secondary | ICD-10-CM

## 2021-08-26 DIAGNOSIS — H109 Unspecified conjunctivitis: Secondary | ICD-10-CM | POA: Insufficient documentation

## 2021-08-26 MED ORDER — ERYTHROMYCIN 5 MG/GM OP OINT: 1.0000 "application " | TOPICAL_OINTMENT | Freq: Three times a day (TID) | OPHTHALMIC | 0 refills | Status: AC

## 2021-08-26 NOTE — Assessment & Plan Note (Signed)
Patient complaining of left eye itching, redness, drainage, son recently diagnosed with conjunctivitis, she did upload a picture of her eye, this is probably the mildest conjunctivitis I have ever seen in my life, adding topical erythromycin.

## 2021-09-16 DIAGNOSIS — F4322 Adjustment disorder with anxiety: Secondary | ICD-10-CM | POA: Diagnosis not present

## 2021-10-13 DIAGNOSIS — F4322 Adjustment disorder with anxiety: Secondary | ICD-10-CM | POA: Diagnosis not present

## 2021-10-26 DIAGNOSIS — Z6830 Body mass index (BMI) 30.0-30.9, adult: Secondary | ICD-10-CM | POA: Diagnosis not present

## 2021-10-26 DIAGNOSIS — Z01419 Encounter for gynecological examination (general) (routine) without abnormal findings: Secondary | ICD-10-CM | POA: Diagnosis not present

## 2021-10-26 DIAGNOSIS — R3915 Urgency of urination: Secondary | ICD-10-CM | POA: Diagnosis not present

## 2021-11-03 ENCOUNTER — Ambulatory Visit: Payer: BC Managed Care – PPO | Admitting: Sports Medicine

## 2021-11-03 DIAGNOSIS — F4322 Adjustment disorder with anxiety: Secondary | ICD-10-CM | POA: Diagnosis not present

## 2021-11-11 DIAGNOSIS — J302 Other seasonal allergic rhinitis: Secondary | ICD-10-CM | POA: Diagnosis not present

## 2021-11-11 DIAGNOSIS — Z8616 Personal history of COVID-19: Secondary | ICD-10-CM | POA: Diagnosis not present

## 2021-11-11 DIAGNOSIS — H1010 Acute atopic conjunctivitis, unspecified eye: Secondary | ICD-10-CM | POA: Diagnosis not present

## 2021-11-11 DIAGNOSIS — J9801 Acute bronchospasm: Secondary | ICD-10-CM | POA: Diagnosis not present

## 2021-11-17 ENCOUNTER — Ambulatory Visit (INDEPENDENT_AMBULATORY_CARE_PROVIDER_SITE_OTHER): Payer: BC Managed Care – PPO

## 2021-11-17 ENCOUNTER — Ambulatory Visit (INDEPENDENT_AMBULATORY_CARE_PROVIDER_SITE_OTHER): Payer: BC Managed Care – PPO | Admitting: Sports Medicine

## 2021-11-17 ENCOUNTER — Encounter: Payer: Self-pay | Admitting: Sports Medicine

## 2021-11-17 DIAGNOSIS — Z0000 Encounter for general adult medical examination without abnormal findings: Secondary | ICD-10-CM

## 2021-11-17 DIAGNOSIS — R062 Wheezing: Secondary | ICD-10-CM

## 2021-11-17 DIAGNOSIS — R059 Cough, unspecified: Secondary | ICD-10-CM

## 2021-11-17 MED ORDER — MONTELUKAST SODIUM 10 MG PO TABS: 10.0000 mg | ORAL_TABLET | Freq: Every day | ORAL | 3 refills | Status: DC

## 2021-11-17 NOTE — Assessment & Plan Note (Signed)
When she returns she will come fasting and we can get routine labs. ?

## 2021-11-17 NOTE — Progress Notes (Signed)
? ? ?  Procedures performed today:   ? ?None. ? ?Independent interpretation of notes and tests performed by another provider:  ? ?None. ? ?Brief History, Exam, Impression, and Recommendations:   ? ?Wheezing ?Pleasant 41 year old female, recent upper respiratory type infection, was treated with steroids, she does take Allegra, Flonase. ?On exam today she still has some expiratory wheezes in both lower lobes, she does have albuterol for use as needed. 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 ?Adding Singulair, continue albuterol, Allegra, Flonase. ?I would like a chest x-ray today. ?We will revisit this in a month and if still having wheezing symptoms we will probably add an inhaled corticosteroid. ? ?Annual physical exam ?When she returns she will come fasting and we can get routine labs. ? ?Chronic process with exacerbation and pharmacologic intervention ? ?___________________________________________ ?Ihor Austin. Benjamin Stain, M.D., ABFM., CAQSM. ?Primary Care and Sports Medicine ?[facility] ? ?Adjunct Instructor of Family Medicine  ?[employer] ?

## 2021-11-17 NOTE — Assessment & Plan Note (Signed)
Pleasant 41 year old female, recent upper respiratory type infection, was treated with steroids, she does take Allegra, Flonase. ?On exam today she still has some expiratory wheezes in both lower lobes, she does have albuterol for use as needed. 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 ?Adding Singulair, continue albuterol, Allegra, Flonase. ?I would like a chest x-ray today. ?We will revisit this in a month and if still having wheezing symptoms we will probably add an inhaled corticosteroid. ?

## 2021-11-18 DIAGNOSIS — F4322 Adjustment disorder with anxiety: Secondary | ICD-10-CM | POA: Diagnosis not present

## 2021-12-02 DIAGNOSIS — F4322 Adjustment disorder with anxiety: Secondary | ICD-10-CM | POA: Diagnosis not present

## 2021-12-16 ENCOUNTER — Ambulatory Visit: Payer: BC Managed Care – PPO | Admitting: Sports Medicine

## 2021-12-23 DIAGNOSIS — N3946 Mixed incontinence: Secondary | ICD-10-CM | POA: Diagnosis not present

## 2021-12-30 DIAGNOSIS — F4322 Adjustment disorder with anxiety: Secondary | ICD-10-CM | POA: Diagnosis not present

## 2022-01-25 DIAGNOSIS — F4322 Adjustment disorder with anxiety: Secondary | ICD-10-CM | POA: Diagnosis not present

## 2022-02-24 DIAGNOSIS — F4322 Adjustment disorder with anxiety: Secondary | ICD-10-CM | POA: Diagnosis not present

## 2023-05-26 ENCOUNTER — Ambulatory Visit: Payer: 59

## 2023-05-26 ENCOUNTER — Ambulatory Visit (INDEPENDENT_AMBULATORY_CARE_PROVIDER_SITE_OTHER): Payer: 59 | Admitting: Sports Medicine

## 2023-05-26 ENCOUNTER — Encounter: Payer: Self-pay | Admitting: Sports Medicine

## 2023-05-26 VITALS — BP 134/85 | HR 61 | Ht 61.0 in | Wt 167.0 lb

## 2023-05-26 DIAGNOSIS — M461 Sacroiliitis, not elsewhere classified: Secondary | ICD-10-CM

## 2023-05-26 DIAGNOSIS — M545 Low back pain, unspecified: Secondary | ICD-10-CM

## 2023-05-26 DIAGNOSIS — M25551 Pain in right hip: Secondary | ICD-10-CM

## 2023-05-26 DIAGNOSIS — R739 Hyperglycemia, unspecified: Secondary | ICD-10-CM

## 2023-05-26 DIAGNOSIS — Z0000 Encounter for general adult medical examination without abnormal findings: Secondary | ICD-10-CM | POA: Diagnosis not present

## 2023-05-26 DIAGNOSIS — F32A Depression, unspecified: Secondary | ICD-10-CM

## 2023-05-26 DIAGNOSIS — E538 Deficiency of other specified B group vitamins: Secondary | ICD-10-CM | POA: Diagnosis not present

## 2023-05-26 DIAGNOSIS — E782 Mixed hyperlipidemia: Secondary | ICD-10-CM

## 2023-05-26 DIAGNOSIS — G8929 Other chronic pain: Secondary | ICD-10-CM

## 2023-05-26 DIAGNOSIS — F419 Anxiety disorder, unspecified: Secondary | ICD-10-CM

## 2023-05-26 NOTE — Assessment & Plan Note (Signed)
Bilateral low back pain worse with walking, history of SI joint sclerotic changes on previous x-rays, we will continue to treat conservatively, updated x-rays, home PT, return to see me in 6 to 8 weeks, we will get advanced imaging and consider HLA-B27 testing if persistent.

## 2023-05-26 NOTE — Assessment & Plan Note (Signed)
She does have a very difficult home situation, separated from alcoholic husband, full custody of the child. Some inattentiveness, she has trouble getting tasks done at home, plans to go to school but is unable to focus. I think a lot of this has to do with the stress and anxiety regarding her home situation, she is not interested in medication at this juncture, but we may consider this in the future, we will start with cognitive behavioral therapy, she had a therapist in [place] in the past and will let me know who this was so that we can place a referral.   Update: Patient would like to see our counselors here.

## 2023-05-26 NOTE — Assessment & Plan Note (Addendum)
Fasting annual physical today, checking routine labs, mammogram was in March at [place], declines flu shot.

## 2023-05-26 NOTE — Progress Notes (Addendum)
 Subjective:    CC: Annual Physical Exam  HPI:  This patient is here for their annual physical  I reviewed the past medical history, family history, social history, surgical history, and allergies today and no changes were needed.  Please see the problem list section below in epic for further details.  Past Medical History: Past Medical History:  Diagnosis Date   Kidney stones    Migraine    Vaginal Pap smear, abnormal    Past Surgical History: Past Surgical History:  Procedure Laterality Date   CESAREAN SECTION     COLPOSCOPY     Social History: Social History   Socioeconomic History   Marital status: Legally Separated    Spouse name: Not on file   Number of children: Not on file   Years of education: Not on file   Highest education level: Not on file  Occupational History   Not on file  Tobacco Use   Smoking status: Former    Types: E-cigarettes   Smokeless tobacco: Never  Substance and Sexual Activity   Alcohol use: Not Currently   Drug use: Never   Sexual activity: Yes    Birth control/protection: None  Other Topics Concern   Not on file  Social History Narrative   Not on file   Social Determinants of Health   Financial Resource Strain: Medium Risk (12/20/2022)   Received from [facility]   Overall Financial Resource Strain (CARDIA)    Difficulty of Paying Living Expenses: Somewhat hard  Food Insecurity: Food Insecurity Present (12/20/2022)   Received from [facility]   Hunger Vital Sign    Worried About Running Out of Food in the Last Year: Sometimes true    Ran Out of Food in the Last Year: Never true  Transportation Needs: No Transportation Needs (12/20/2022)   Received from [facility] - Transportation    Lack of Transportation (Medical): No    Lack of Transportation (Non-Medical): No  Physical Activity: Insufficiently Active (12/20/2022)   Received from [facility]   Exercise Vital Sign    Days of Exercise per Week: 1 day     Minutes of Exercise per Session: 30 min  Stress: Stress Concern Present (12/20/2022)   Received from [facility] - Occupational Stress Questionnaire    Feeling of Stress : To some extent  Social Connections: Moderately Integrated (12/20/2022)   [facility] from [facility]   Social Network    How would you rate your social network (family, work, friends)?: Adequate participation with social networks   Family History: Family History  Problem Relation Age of Onset   Ovarian cancer Maternal Aunt 45   Cancer Maternal Grandmother 21       unkown primary, could be breast   Hypertension Father    Heart attack Paternal Grandfather    Allergies: No Known Allergies Medications: See med rec.  Review of Systems: No headache, visual changes, nausea, vomiting, diarrhea, constipation, dizziness, abdominal pain, skin rash, fevers, chills, night sweats, swollen lymph nodes, weight loss, chest pain, body aches, joint swelling, muscle aches, shortness of breath, mood changes, visual or auditory hallucinations.  Objective:    General: Well Developed, well nourished, and in no acute distress.  Neuro: Alert and oriented x3, extra-ocular muscles intact, sensation grossly intact. Cranial nerves II through XII are intact, motor, sensory, and coordinative functions are all intact. HEENT: Normocephalic, atraumatic, pupils equal round reactive to light, neck supple, no masses, no  lymphadenopathy, thyroid nonpalpable. Oropharynx, nasopharynx, external ear canals are unremarkable. Skin: Warm and dry, no rashes noted.  Cardiac: Regular rate and rhythm, no murmurs rubs or gallops.  Respiratory: Clear to auscultation bilaterally. Not using accessory muscles, speaking in full sentences.  Abdominal: Soft, nontender, nondistended, positive bowel sounds, no masses, no organomegaly.  Musculoskeletal: Shoulder, elbow, wrist, hip, knee, ankle stable, and with full range of  motion.  Impression and Recommendations:    The patient was counselled, risk factors were discussed, anticipatory guidance given.  Annual physical exam Fasting annual physical today, checking routine labs, mammogram was in March at [place], declines flu shot.  Chronic bilateral low back pain without sciatica Bilateral low back pain worse with walking, history of SI joint sclerotic changes on previous x-rays, we will continue to treat conservatively, updated x-rays, home PT, return to see me in 6 to 8 weeks, we will get advanced imaging and consider HLA-B27 testing if persistent.  Anxiety and depression She does have a very difficult home situation, separated from alcoholic husband, full custody of the child. Some inattentiveness, she has trouble getting tasks done at home, plans to go to school but is unable to focus. I think a lot of this has to do with the stress and anxiety regarding her home situation, she is not interested in medication at this juncture, but we may consider this in the future, we will start with cognitive behavioral therapy, she had a therapist in [place] in the past and will let me know who this was so that we can place a referral.   Update: Patient would like to see our counselors here.  Mixed hyperlipidemia Patient plans low-cholesterol diet, she will do this for 3 months and then we can recheck lipids with an LDL goal of less than 100.   ____________________________________________ Debby PARAS. Curtis, M.D., ABFM., CAQSM., AME. Primary Care and Sports Medicine  [facility]  Adjunct Professor of [facility] Medicine  [employer]  Restaurant Manager, [employer]

## 2023-05-27 LAB — LIPID PANEL
Chol/HDL Ratio: 5.1 ratio — ABNORMAL HIGH (ref 0.0–4.4)
Cholesterol, Total: 202 mg/dL — ABNORMAL HIGH (ref 100–199)
HDL: 40 mg/dL (ref >39)
LDL Chol Calc (NIH): 143 mg/dL — ABNORMAL HIGH (ref 0–99)
Triglycerides: 106 mg/dL (ref 0–149)
VLDL Cholesterol Cal: 19 mg/dL (ref 5–40)

## 2023-05-27 LAB — COMPREHENSIVE METABOLIC PANEL
ALT: 16 [IU]/L (ref 0–32)
AST: 19 [IU]/L (ref 0–40)
Albumin: 4.5 g/dL (ref 3.9–4.9)
Alkaline Phosphatase: 53 [IU]/L (ref 44–121)
BUN/Creatinine Ratio: 24 — ABNORMAL HIGH (ref 9–23)
BUN: 19 mg/dL (ref 6–24)
Bilirubin Total: 0.3 mg/dL (ref 0.0–1.2)
CO2: 21 mmol/L (ref 20–29)
Calcium: 9.6 mg/dL (ref 8.7–10.2)
Chloride: 106 mmol/L (ref 96–106)
Creatinine, Ser: 0.78 mg/dL (ref 0.57–1.00)
Globulin, Total: 2.7 g/dL (ref 1.5–4.5)
Glucose: 92 mg/dL (ref 70–99)
Potassium: 5.3 mmol/L — ABNORMAL HIGH (ref 3.5–5.2)
Sodium: 139 mmol/L (ref 134–144)
Total Protein: 7.2 g/dL (ref 6.0–8.5)
eGFR: 98 mL/min/{1.73_m2} (ref >59)

## 2023-05-27 LAB — HEMOGLOBIN A1C
Est. average glucose Bld gHb Est-mCnc: 111 mg/dL
Hgb A1c MFr Bld: 5.5 % (ref 4.8–5.6)

## 2023-05-27 LAB — CBC
Hematocrit: 41.9 % (ref 34.0–46.6)
Hemoglobin: 13.9 g/dL (ref 11.1–15.9)
MCH: 30.3 pg (ref 26.6–33.0)
MCHC: 33.2 g/dL (ref 31.5–35.7)
MCV: 92 fL (ref 79–97)
Platelets: 266 10*3/uL (ref 150–450)
RBC: 4.58 x10E6/uL (ref 3.77–5.28)
RDW: 12.7 % (ref 11.7–15.4)
WBC: 7 10*3/uL (ref 3.4–10.8)

## 2023-05-27 LAB — VITAMIN B12: Vitamin B-12: 456 pg/mL (ref 232–1245)

## 2023-05-27 LAB — TSH: TSH: 2.35 u[IU]/mL (ref 0.450–4.500)

## 2023-05-29 NOTE — Addendum Note (Signed)
Addended by: Monica Becton on: 05/29/2023 01:02 PM   Modules accepted: Orders

## 2023-05-30 DIAGNOSIS — E782 Mixed hyperlipidemia: Secondary | ICD-10-CM | POA: Insufficient documentation

## 2023-05-30 NOTE — Assessment & Plan Note (Signed)
Patient plans low-cholesterol diet, she will do this for 3 months and then we can recheck lipids with an LDL goal of less than 100.

## 2023-07-28 ENCOUNTER — Ambulatory Visit: Payer: 59 | Admitting: Sports Medicine

## 2023-08-07 ENCOUNTER — Ambulatory Visit: Payer: 59 | Admitting: Sports Medicine

## 2023-08-07 VITALS — BP 127/88 | HR 72 | Ht 61.0 in | Wt 168.0 lb

## 2023-08-07 DIAGNOSIS — E669 Obesity, unspecified: Secondary | ICD-10-CM | POA: Diagnosis not present

## 2023-08-07 DIAGNOSIS — E782 Mixed hyperlipidemia: Secondary | ICD-10-CM | POA: Diagnosis not present

## 2023-08-07 DIAGNOSIS — M545 Low back pain, unspecified: Secondary | ICD-10-CM | POA: Diagnosis not present

## 2023-08-07 DIAGNOSIS — F419 Anxiety disorder, unspecified: Secondary | ICD-10-CM

## 2023-08-07 DIAGNOSIS — G8929 Other chronic pain: Secondary | ICD-10-CM

## 2023-08-07 DIAGNOSIS — L989 Disorder of the skin and subcutaneous tissue, unspecified: Secondary | ICD-10-CM

## 2023-08-07 DIAGNOSIS — F32A Depression, unspecified: Secondary | ICD-10-CM

## 2023-08-07 MED ORDER — PHENTERMINE HCL 37.5 MG PO TABS: ORAL_TABLET | ORAL | 0 refills | Status: DC

## 2023-08-07 NOTE — Assessment & Plan Note (Signed)
Small papule right nasolabial fold, somewhat itchy, present for several years and unchanged, I did offer cryotherapy, patient would like a dermatologist consult.

## 2023-08-07 NOTE — Progress Notes (Signed)
    Procedures performed today:    None.  Independent interpretation of notes and tests performed by another provider:   None.  Brief History, Exam, Impression, and Recommendations:    Anxiety and depression Improved considerably, her family came to visit, ex-husband has not been contacting her as much, she never did therapy, we did not do medication but she is happy from a mood standpoint now.  Chronic bilateral low back pain without sciatica Please see prior notes, resolved with home conditioning.  Mixed hyperlipidemia Has been doing her best with a low-cholesterol diet, rechecking fasting lipids in about a month or 2. We will add Crestor if insufficient improvement.  Obesity (BMI 30-39.9) Has tried multiple modalities including nutrition management without success, exercise prescription without success. Adding phentermine, we will do monthly weight checks and refills, she would like to lose about 20 pounds or so.  Lesion of face Small papule right nasolabial fold, somewhat itchy, present for several years and unchanged, I did offer cryotherapy, patient would like a dermatologist consult.    ____________________________________________ Ihor Austin. Benjamin Stain, M.D., ABFM., CAQSM., AME. Primary Care and Sports Medicine [place] [facility]  Adjunct Professor of Family Medicine  [place] [employer]  Restaurant manager, fast food

## 2023-08-07 NOTE — Assessment & Plan Note (Signed)
Has tried multiple modalities including nutrition management without success, exercise prescription without success. Adding phentermine, we will do monthly weight checks and refills, she would like to lose about 20 pounds or so.

## 2023-08-07 NOTE — Assessment & Plan Note (Signed)
Please see prior notes, resolved with home conditioning.

## 2023-08-07 NOTE — Assessment & Plan Note (Signed)
Improved considerably, her family came to visit, ex-husband has not been contacting her as much, she never did therapy, we did not do medication but she is happy from a mood standpoint now.

## 2023-08-07 NOTE — Assessment & Plan Note (Signed)
Has been doing her best with a low-cholesterol diet, rechecking fasting lipids in about a month or 2. We will add Crestor if insufficient improvement.

## 2023-09-11 ENCOUNTER — Ambulatory Visit: Payer: 59 | Admitting: Sports Medicine

## 2023-09-11 DIAGNOSIS — E669 Obesity, unspecified: Secondary | ICD-10-CM | POA: Diagnosis not present

## 2023-09-11 MED ORDER — PHENTERMINE HCL 37.5 MG PO TABS: 18.7500 mg | ORAL_TABLET | Freq: Two times a day (BID) | ORAL | 0 refills | Status: DC

## 2023-09-11 NOTE — Progress Notes (Signed)
    Procedures performed today:    None.  Independent interpretation of notes and tests performed by another provider:   None.  Brief History, Exam, Impression, and Recommendations:    Obesity (BMI 30-39.9) This pleasant 43 year old female returns, we are treating her for weight loss, she has tried multiple modalities including nutrition management, exercise prescription, all without success. At the last visit we added phentermine, after the first month she has lost about 7 pounds. She has no 13 pounds to go, refilling phentermine, she can split do 1 pill in the morning and 1 at noon to help curb some of her dinner cravings though if she has insomnia she will move the second dose a little earlier in the day. Return to see me in a month.    ____________________________________________ Ihor Austin. Benjamin Stain, M.D., ABFM., CAQSM., AME. Primary Care and Sports Medicine [place] [facility]  Adjunct Professor of Family Medicine  [place] [employer] [facility] [employer]  Restaurant manager, fast food

## 2023-09-11 NOTE — Assessment & Plan Note (Signed)
 This pleasant 43 year old female returns, we are treating her for weight loss, she has tried multiple modalities including nutrition management, exercise prescription, all without success. At the last visit we added phentermine, after the first month she has lost about 7 pounds. She has no 13 pounds to go, refilling phentermine, she can split do 1 pill in the morning and 1 at noon to help curb some of her dinner cravings though if she has insomnia she will move the second dose a little earlier in the day. Return to see me in a month.

## 2023-10-12 ENCOUNTER — Ambulatory Visit: Payer: 59 | Admitting: Sports Medicine

## 2023-10-12 ENCOUNTER — Encounter: Payer: Self-pay | Admitting: Sports Medicine

## 2023-10-12 ENCOUNTER — Ambulatory Visit: Admitting: Sports Medicine

## 2023-10-12 DIAGNOSIS — E669 Obesity, unspecified: Secondary | ICD-10-CM | POA: Diagnosis not present

## 2023-10-12 MED ORDER — PHENTERMINE HCL 37.5 MG PO TABS: 18.7500 mg | ORAL_TABLET | Freq: Two times a day (BID) | ORAL | 0 refills | Status: DC

## 2023-10-12 NOTE — Progress Notes (Signed)
    Procedures performed today:    None.  Independent interpretation of notes and tests performed by another provider:   None.  Brief History, Exam, Impression, and Recommendations:    Obesity (BMI 30-39.9) 7 pound weight loss the first month, 3 pounds the second month, she did not do the half pill twice a day we had discussed to help curve dinner cravings. Refilling phentermine for the third month. Today she tells me she still has another 20 pounds to go. I do suspect she will end up needing a GLP-1.  Chronic process not at goal with pharmacologic intervention  ____________________________________________ Ihor Austin. Benjamin Stain, M.D., ABFM., CAQSM., AME. Primary Care and Sports Medicine [place] [facility]  Adjunct Professor of Family Medicine  [place] [employer]  Restaurant manager, fast food

## 2023-10-12 NOTE — Assessment & Plan Note (Signed)
 7 pound weight loss the first month, 3 pounds the second month, she did not do the half pill twice a day we had discussed to help curve dinner cravings. Refilling phentermine for the third month. Today she tells me she still has another 20 pounds to go. I do suspect she will end up needing a GLP-1.

## 2023-10-30 ENCOUNTER — Encounter (INDEPENDENT_AMBULATORY_CARE_PROVIDER_SITE_OTHER): Admitting: Sports Medicine

## 2023-10-30 DIAGNOSIS — J302 Other seasonal allergic rhinitis: Secondary | ICD-10-CM

## 2023-10-30 DIAGNOSIS — R062 Wheezing: Secondary | ICD-10-CM

## 2023-10-30 MED ORDER — FEXOFENADINE HCL 180 MG PO TABS: 180.0000 mg | ORAL_TABLET | Freq: Every day | ORAL | 3 refills | Status: AC

## 2023-10-30 MED ORDER — PREDNISONE 50 MG PO TABS: 50.0000 mg | ORAL_TABLET | Freq: Every day | ORAL | 0 refills | Status: DC

## 2023-10-30 MED ORDER — MONTELUKAST SODIUM 10 MG PO TABS: 10.0000 mg | ORAL_TABLET | Freq: Every day | ORAL | 3 refills | Status: AC

## 2023-10-30 NOTE — Telephone Encounter (Signed)

## 2023-10-30 NOTE — Assessment & Plan Note (Signed)
 Seasonal allergies with itchy eyes, runny nose, Pataday not enough, Singulair not enough. Adding a burst of steroids, fexofenadine, continue Pataday and Singulair. Next year she really does need to start an oral antihistamine sometime in March before the pollen really starts.

## 2023-11-09 ENCOUNTER — Ambulatory Visit (INDEPENDENT_AMBULATORY_CARE_PROVIDER_SITE_OTHER): Admitting: Sports Medicine

## 2023-11-09 ENCOUNTER — Encounter: Payer: Self-pay | Admitting: Sports Medicine

## 2023-11-09 ENCOUNTER — Ambulatory Visit: Admitting: Sports Medicine

## 2023-11-09 VITALS — BP 119/71 | HR 73 | Resp 20 | Ht 61.0 in | Wt 155.0 lb

## 2023-11-09 DIAGNOSIS — E669 Obesity, unspecified: Secondary | ICD-10-CM | POA: Diagnosis not present

## 2023-11-09 DIAGNOSIS — J302 Other seasonal allergic rhinitis: Secondary | ICD-10-CM

## 2023-11-09 MED ORDER — PHENTERMINE HCL 37.5 MG PO TABS: 18.7500 mg | ORAL_TABLET | Freq: Two times a day (BID) | ORAL | 0 refills | Status: DC

## 2023-11-09 MED ORDER — AZELASTINE HCL 0.1 % NA SOLN: 2.0000 | Freq: Two times a day (BID) | NASAL | 1 refills | Status: AC

## 2023-11-09 NOTE — Assessment & Plan Note (Signed)
 Moderate seasonal allergies, itchy eyes, runny nose, Singulair  and Pataday were not enough, we added a burst of steroids and Allegra . She is doing a lot better but still has significant nasal symptoms so we will have her do nasal azelastine  for now.

## 2023-11-09 NOTE — Assessment & Plan Note (Signed)
 This pleasant 44 year old female returns, we are helping her with weight loss, she got 7 pound weight loss the first month, 3 pounds the second month, 3 pounds the third month. She still has about another 15 pounds to go. We will refill her phentermine , I do suspect she is starting to plateau on her weight loss, she understands that we may be needing to GLP-1 at the follow-up visit.   No adverse effects currently.

## 2023-11-09 NOTE — Progress Notes (Signed)
    Procedures performed today:    None.  Independent interpretation of notes and tests performed by another provider:   None.  Brief History, Exam, Impression, and Recommendations:    Obesity (BMI 30-39.9) This pleasant 43 year old female returns, we are helping her with weight loss, she got 7 pound weight loss the first month, 3 pounds the second month, 3 pounds the third month. She still has about another 15 pounds to go. We will refill her phentermine , I do suspect she is starting to plateau on her weight loss, she understands that we may be needing to GLP-1 at the follow-up visit.   No adverse effects currently.  Seasonal allergies Moderate seasonal allergies, itchy eyes, runny nose, Singulair  and Pataday were not enough, we added a burst of steroids and Allegra . She is doing a lot better but still has significant nasal symptoms so we will have her do nasal azelastine  for now.    ____________________________________________ Joselyn Nicely. Sandy Crumb, M.D., ABFM., CAQSM., AME. Primary Care and Sports Medicine [place] [facility]  Adjunct Professor of [facility] Medicine  [employer]  Restaurant manager, fast food

## 2023-11-13 ENCOUNTER — Ambulatory Visit: Admitting: Sports Medicine

## 2023-12-15 ENCOUNTER — Ambulatory Visit: Admitting: Sports Medicine

## 2023-12-21 ENCOUNTER — Ambulatory Visit (INDEPENDENT_AMBULATORY_CARE_PROVIDER_SITE_OTHER): Admitting: Sports Medicine

## 2023-12-21 DIAGNOSIS — E669 Obesity, unspecified: Secondary | ICD-10-CM

## 2023-12-21 MED ORDER — PHENTERMINE HCL 37.5 MG PO TABS: 18.7500 mg | ORAL_TABLET | Freq: Two times a day (BID) | ORAL | 0 refills | Status: DC

## 2023-12-21 NOTE — Progress Notes (Signed)
    Procedures performed today:    None.  Independent interpretation of notes and tests performed by another provider:   None.  Brief History, Exam, Impression, and Recommendations:    Obesity (BMI 30-39.9) This very pleasant 43 year old female returns, we have been helping with weight loss, she lost 7 pounds the first month, 3 pounds the second month, 3 pounds the third month,, 7 pounds the fourth month. She has approximately 8 more pounds to go though she is only thinking 1 more month of phentermine . Refilling, follow-up as needed. We again discussed GLP-1s.    ____________________________________________ Joselyn Nicely. Sandy Crumb, M.D., ABFM., CAQSM., AME. Primary Care and Sports Medicine [place] [facility]  Adjunct Professor of [facility] Medicine  [employer]  Restaurant manager, fast food

## 2023-12-21 NOTE — Assessment & Plan Note (Signed)
 This very pleasant 43 year old female returns, we have been helping with weight loss, she lost 7 pounds the first month, 3 pounds the second month, 3 pounds the third month,, 7 pounds the fourth month. She has approximately 8 more pounds to go though she is only thinking 1 more month of phentermine . Refilling, follow-up as needed. We again discussed GLP-1s.

## 2024-01-18 ENCOUNTER — Ambulatory Visit: Admitting: Sports Medicine

## 2024-01-25 ENCOUNTER — Encounter: Payer: Self-pay | Admitting: Sports Medicine

## 2024-01-27 ENCOUNTER — Ambulatory Visit: Payer: Self-pay | Admitting: Sports Medicine

## 2024-01-27 LAB — LIPID PANEL
Chol/HDL Ratio: 4.4 ratio (ref 0.0–4.4)
Cholesterol, Total: 184 mg/dL (ref 100–199)
HDL: 42 mg/dL (ref >39)
LDL Chol Calc (NIH): 124 mg/dL — ABNORMAL HIGH (ref 0–99)
Triglycerides: 99 mg/dL (ref 0–149)
VLDL Cholesterol Cal: 18 mg/dL (ref 5–40)

## 2024-01-30 ENCOUNTER — Ambulatory Visit: Admitting: Sports Medicine

## 2024-01-30 ENCOUNTER — Encounter: Payer: Self-pay | Admitting: Sports Medicine

## 2024-01-30 VITALS — BP 108/70 | HR 85 | Temp 98.3°F | Resp 20 | Ht 61.0 in | Wt 149.0 lb

## 2024-01-30 DIAGNOSIS — E669 Obesity, unspecified: Secondary | ICD-10-CM

## 2024-01-30 DIAGNOSIS — G43009 Migraine without aura, not intractable, without status migrainosus: Secondary | ICD-10-CM | POA: Diagnosis not present

## 2024-01-30 DIAGNOSIS — G43909 Migraine, unspecified, not intractable, without status migrainosus: Secondary | ICD-10-CM | POA: Insufficient documentation

## 2024-01-30 MED ORDER — ZEPBOUND 2.5 MG/0.5ML ~~LOC~~ SOAJ: 2.5000 mg | SUBCUTANEOUS | 0 refills | Status: AC

## 2024-01-30 MED ORDER — DEXAMETHASONE SODIUM PHOSPHATE 10 MG/ML IJ SOLN
10.0000 mg | Freq: Once | INTRAMUSCULAR | Status: AC
  Administered 2024-01-30: 10 mg via INTRAMUSCULAR

## 2024-01-30 MED ORDER — KETOROLAC TROMETHAMINE 60 MG/2ML IM SOLN
30.0000 mg | Freq: Once | INTRAMUSCULAR | Status: AC
  Administered 2024-01-30: 30 mg via INTRAMUSCULAR

## 2024-01-30 NOTE — Assessment & Plan Note (Signed)
 Also struggling with weight loss, body mass index is currently 28, she does have the comorbidities of hyperlipidemia and lumbar degenerative disc disease. We will try GLP-1 treatment with Zepbound .  For insurance purposes, she will be enrolled in a multidisciplinary weight loss program with calorie counting, and exercise prescription, she will not be taking any other weight loss medications and she has no contraindications to GLP-1 treatment. I would like to see her back approximately 1 month after starting treatment.  If unable to get branded GLP-1's we will try compounded tirzepatide .

## 2024-01-30 NOTE — Progress Notes (Signed)
    Procedures performed today:    None.  Independent interpretation of notes and tests performed by another provider:   None.  Brief History, Exam, Impression, and Recommendations:    Migraine headache Today this pleasant 43 year old female has had a headache, mostly left-sided, dull with photophobia, photophobia, nausea, no focal neurologic symptoms. Neurologic exam normal. Toradol  and Solu-Medrol intramuscular. We can discuss prevention if needed at the follow-up.  Obesity (BMI 30-39.9) Also struggling with weight loss, body mass index is currently 28, she does have the comorbidities of hyperlipidemia and lumbar degenerative disc disease. We will try GLP-1 treatment with Zepbound .  For insurance purposes, she will be enrolled in a multidisciplinary weight loss program with calorie counting, and exercise prescription, she will not be taking any other weight loss medications and she has no contraindications to GLP-1 treatment. I would like to see her back approximately 1 month after starting treatment.  If unable to get branded GLP-1's we will try compounded tirzepatide .    ____________________________________________ Jennifer Jacobson, M.D., ABFM., CAQSM., AME. Primary Care and Sports Medicine [place] [facility]  Adjunct Professor of [facility] Medicine  [employer]  Restaurant manager, fast food

## 2024-01-30 NOTE — Assessment & Plan Note (Signed)
 Today this pleasant 43 year old female has had a headache, mostly left-sided, dull with photophobia, photophobia, nausea, no focal neurologic symptoms. Neurologic exam normal. Toradol  and Solu-Medrol intramuscular. We can discuss prevention if needed at the follow-up.

## 2024-03-19 ENCOUNTER — Encounter: Payer: Self-pay | Admitting: Sports Medicine
